# Patient Record
Sex: Female | Born: 1980 | Race: White | Hispanic: No | Marital: Married | Smoking: Never smoker
Health system: Southern US, Community
[De-identification: ages and names within clinical notes are randomized; demographics above are authoritative.]

## PROBLEM LIST (undated history)

## (undated) DIAGNOSIS — F32A Depression, unspecified: Secondary | ICD-10-CM

## (undated) DIAGNOSIS — J45909 Unspecified asthma, uncomplicated: Secondary | ICD-10-CM

## (undated) DIAGNOSIS — R519 Headache, unspecified: Secondary | ICD-10-CM

## (undated) DIAGNOSIS — R51 Headache: Secondary | ICD-10-CM

## (undated) DIAGNOSIS — E039 Hypothyroidism, unspecified: Secondary | ICD-10-CM

## (undated) DIAGNOSIS — F419 Anxiety disorder, unspecified: Secondary | ICD-10-CM

## (undated) DIAGNOSIS — I499 Cardiac arrhythmia, unspecified: Secondary | ICD-10-CM

## (undated) DIAGNOSIS — F329 Major depressive disorder, single episode, unspecified: Secondary | ICD-10-CM

## (undated) HISTORY — PX: DIAGNOSTIC LAPAROSCOPY: SUR761

## (undated) HISTORY — PX: TONSILLECTOMY: SUR1361

## (undated) HISTORY — PX: ABDOMINAL HYSTERECTOMY: SHX81

---
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
## 2000-11-23 ENCOUNTER — Other Ambulatory Visit: Admission: RE | Admit: 2000-11-23 | Discharge: 2000-11-23 | Payer: Self-pay | Admitting: Obstetrics and Gynecology

## 2001-01-26 ENCOUNTER — Encounter: Admission: RE | Admit: 2001-01-26 | Discharge: 2001-01-26 | Payer: Self-pay | Admitting: Obstetrics and Gynecology

## 2001-01-26 ENCOUNTER — Encounter: Payer: Self-pay | Admitting: Obstetrics and Gynecology

## 2001-05-19 ENCOUNTER — Encounter: Payer: Self-pay | Admitting: Obstetrics and Gynecology

## 2001-05-19 ENCOUNTER — Encounter: Admission: RE | Admit: 2001-05-19 | Discharge: 2001-05-19 | Payer: Self-pay | Admitting: Obstetrics and Gynecology

## 2001-09-08 ENCOUNTER — Other Ambulatory Visit: Admission: RE | Admit: 2001-09-08 | Discharge: 2001-09-08 | Payer: Self-pay

## 2001-11-22 ENCOUNTER — Ambulatory Visit (HOSPITAL_COMMUNITY): Admission: RE | Admit: 2001-11-22 | Discharge: 2001-11-22 | Payer: Self-pay | Admitting: Obstetrics and Gynecology

## 2003-12-17 ENCOUNTER — Other Ambulatory Visit: Admission: RE | Admit: 2003-12-17 | Discharge: 2003-12-17 | Payer: Self-pay | Admitting: Obstetrics and Gynecology

## 2003-12-31 ENCOUNTER — Ambulatory Visit (HOSPITAL_COMMUNITY): Admission: RE | Admit: 2003-12-31 | Discharge: 2003-12-31 | Payer: Self-pay | Admitting: Family Medicine

## 2005-03-09 ENCOUNTER — Other Ambulatory Visit: Admission: RE | Admit: 2005-03-09 | Discharge: 2005-03-09 | Payer: Self-pay | Admitting: Obstetrics and Gynecology

## 2006-02-05 ENCOUNTER — Emergency Department: Payer: Self-pay | Admitting: Emergency Medicine

## 2006-08-19 ENCOUNTER — Observation Stay: Payer: Self-pay

## 2006-08-25 ENCOUNTER — Inpatient Hospital Stay: Payer: Self-pay | Admitting: Obstetrics and Gynecology

## 2007-04-13 ENCOUNTER — Ambulatory Visit: Payer: Self-pay | Admitting: Internal Medicine

## 2007-12-03 ENCOUNTER — Ambulatory Visit: Payer: Self-pay | Admitting: Family Medicine

## 2008-10-12 ENCOUNTER — Ambulatory Visit: Payer: Self-pay | Admitting: Internal Medicine

## 2008-12-19 ENCOUNTER — Ambulatory Visit: Payer: Self-pay | Admitting: Obstetrics and Gynecology

## 2009-04-18 ENCOUNTER — Observation Stay: Payer: Self-pay

## 2009-05-07 ENCOUNTER — Observation Stay: Payer: Self-pay | Admitting: Obstetrics and Gynecology

## 2009-05-08 ENCOUNTER — Ambulatory Visit: Payer: Self-pay | Admitting: Obstetrics and Gynecology

## 2009-05-29 ENCOUNTER — Inpatient Hospital Stay: Payer: Self-pay | Admitting: Obstetrics and Gynecology

## 2009-12-15 ENCOUNTER — Ambulatory Visit: Payer: Self-pay | Admitting: Internal Medicine

## 2010-11-13 ENCOUNTER — Emergency Department: Payer: Self-pay | Admitting: Emergency Medicine

## 2013-10-21 ENCOUNTER — Encounter (HOSPITAL_COMMUNITY): Payer: Self-pay | Admitting: Emergency Medicine

## 2013-10-21 ENCOUNTER — Emergency Department (INDEPENDENT_AMBULATORY_CARE_PROVIDER_SITE_OTHER): Payer: BC Managed Care – PPO

## 2013-10-21 ENCOUNTER — Emergency Department (HOSPITAL_COMMUNITY)
Admission: EM | Admit: 2013-10-21 | Discharge: 2013-10-21 | Disposition: A | Discharge status: Home / Self Care | Payer: BC Managed Care – PPO | Source: Home / Self Care | Attending: Family Medicine | Admitting: Family Medicine

## 2013-10-21 DIAGNOSIS — J4521 Mild intermittent asthma with (acute) exacerbation: Secondary | ICD-10-CM

## 2013-10-21 DIAGNOSIS — R071 Chest pain on breathing: Secondary | ICD-10-CM

## 2013-10-21 DIAGNOSIS — J45901 Unspecified asthma with (acute) exacerbation: Secondary | ICD-10-CM

## 2013-10-21 DIAGNOSIS — R0789 Other chest pain: Secondary | ICD-10-CM

## 2013-10-21 HISTORY — DX: Unspecified asthma, uncomplicated: J45.909

## 2013-10-21 MED ORDER — METHYLPREDNISOLONE SODIUM SUCC 125 MG IJ SOLR
125.0000 mg | Freq: Once | INTRAMUSCULAR | Status: AC
  Administered 2013-10-21: 125 mg via INTRAMUSCULAR

## 2013-10-21 MED ORDER — IPRATROPIUM BROMIDE 0.02 % IN SOLN
0.5000 mg | Freq: Once | RESPIRATORY_TRACT | Status: AC
  Administered 2013-10-21: 0.5 mg via RESPIRATORY_TRACT

## 2013-10-21 MED ORDER — DEXTROMETHORPHAN POLISTIREX 30 MG/5ML PO LQCR: 60.0000 mg | Freq: Two times a day (BID) | ORAL | Status: DC

## 2013-10-21 MED ORDER — ALBUTEROL SULFATE (5 MG/ML) 0.5% IN NEBU
5.0000 mg | INHALATION_SOLUTION | Freq: Once | RESPIRATORY_TRACT | Status: AC
  Administered 2013-10-21: 5 mg via RESPIRATORY_TRACT

## 2013-10-21 MED ORDER — ALBUTEROL SULFATE (5 MG/ML) 0.5% IN NEBU
INHALATION_SOLUTION | RESPIRATORY_TRACT | Status: AC
  Filled 2013-10-21: qty 1

## 2013-10-21 MED ORDER — METHYLPREDNISOLONE SODIUM SUCC 125 MG IJ SOLR
INTRAMUSCULAR | Status: AC
  Filled 2013-10-21: qty 2

## 2013-10-21 MED ORDER — IPRATROPIUM BROMIDE 0.02 % IN SOLN
RESPIRATORY_TRACT | Status: AC
  Filled 2013-10-21: qty 2.5

## 2013-10-21 MED ORDER — AZITHROMYCIN 250 MG PO TABS: ORAL_TABLET | ORAL | Status: DC

## 2013-10-21 MED ORDER — SODIUM CHLORIDE 0.9 % IN NEBU
INHALATION_SOLUTION | RESPIRATORY_TRACT | Status: AC
  Filled 2013-10-21: qty 3

## 2013-10-21 NOTE — ED Notes (Signed)
Pt reports tightness in chest, cough, and difficulty breathing. Denies fever or chills.

## 2013-10-21 NOTE — ED Provider Notes (Signed)
CSN: [id number]     Arrival date & time 10/21/13  0941 History   First MD Initiated Contact with Patient 10/21/13 1003     Chief Complaint  Patient presents with  . Respiratory Distress  . Cough   (Consider location/radiation/quality/duration/timing/severity/associated sxs/prior Treatment) Patient is a 32 y.o. female presenting with cough. The history is provided by the patient.  Cough Cough characteristics:  Productive and harsh Sputum characteristics:  Yellow Severity:  Moderate Onset quality:  Gradual Duration:  1 week Progression:  Worsening Chronicity:  New Smoker: no   Context: upper respiratory infection   Ineffective treatments:  Beta-agonist inhaler Associated symptoms: chest pain, shortness of breath and wheezing   Associated symptoms: no rhinorrhea     Past Medical History  Diagnosis Date  . Asthma    History reviewed. No pertinent past surgical history. History reviewed. No pertinent family history. History  Substance Use Topics  . Smoking status: Never Smoker   . Smokeless tobacco: Not on file  . Alcohol Use: No   OB History   Grav Para Term Preterm Abortions TAB SAB Ect Mult Living                 Review of Systems  Constitutional: Negative.   HENT: Positive for congestion. Negative for rhinorrhea.   Respiratory: Positive for cough, shortness of breath and wheezing.   Cardiovascular: Positive for chest pain.  Gastrointestinal: Negative.     Allergies  Codeine  Home Medications   Current Outpatient Rx  Name  Route  Sig  Dispense  Refill  . escitalopram (LEXAPRO) 10 MG tablet   Oral   Take 10 mg by mouth daily.         Marland Kitchen azithromycin (ZITHROMAX Z-PAK) 250 MG tablet      Take as directed on pack   6 each   0   . dextromethorphan (DELSYM) 30 MG/5ML liquid   Oral   Take 10 mLs (60 mg total) by mouth 2 (two) times daily. Prn cough   89 mL   1    BP 124/85  Pulse 96  Temp(Src) 97.8 F (36.6 C) (Oral)  SpO2 95% Physical Exam   Nursing note and vitals reviewed. Constitutional: She is oriented to person, place, and time. She appears well-developed and well-nourished.  HENT:  Head: Normocephalic.  Right Ear: External ear normal.  Left Ear: External ear normal.  Mouth/Throat: Oropharynx is clear and moist.  Eyes: Conjunctivae are normal. Pupils are equal, round, and reactive to light.  Neck: Normal range of motion. Neck supple.  Pulmonary/Chest: Effort normal and breath sounds normal. No respiratory distress. She has no wheezes.  Lymphadenopathy:    She has no cervical adenopathy.  Neurological: She is alert and oriented to person, place, and time.  Skin: Skin is warm and dry.    ED Course  Procedures (including critical care time) Labs Review Labs Reviewed - No data to display Imaging Review Dg Chest 2 View  10/21/2013   CLINICAL DATA:  Cough, shortness of Breath  EXAM: CHEST  2 VIEW  COMPARISON:  None.  FINDINGS: Cardiomediastinal silhouette is unremarkable. No acute infiltrate or pleural effusion. No pulmonary edema. Bony thorax is unremarkable. Mild perihilar increased bronchial markings.  IMPRESSION: No acute infiltrate or pulmonary edema. Mild perihilar increased bronchial markings without focal consolidation.   Electronically Signed   By: Natasha Mead M.D.   On: 10/21/2013 10:18    EKG Interpretation    Date/Time:    Ventricular Rate:  PR Interval:    QRS Duration:   QT Interval:    QTC Calculation:   R Axis:     Text Interpretation:              MDM      Linna Hoff, MD 10/21/13 1052

## 2015-02-12 ENCOUNTER — Ambulatory Visit
Admit: 2015-02-12 | Disposition: A | Discharge status: Home / Self Care | Payer: Self-pay | Attending: Obstetrics and Gynecology | Admitting: Obstetrics and Gynecology

## 2016-05-12 ENCOUNTER — Other Ambulatory Visit: Payer: Self-pay

## 2016-05-17 ENCOUNTER — Other Ambulatory Visit: Payer: Self-pay | Admitting: Internal Medicine

## 2016-05-17 DIAGNOSIS — R609 Edema, unspecified: Secondary | ICD-10-CM

## 2016-05-19 ENCOUNTER — Ambulatory Visit (INDEPENDENT_AMBULATORY_CARE_PROVIDER_SITE_OTHER): Payer: BC Managed Care – PPO

## 2016-05-19 ENCOUNTER — Other Ambulatory Visit: Payer: Self-pay

## 2016-05-19 DIAGNOSIS — R609 Edema, unspecified: Secondary | ICD-10-CM

## 2016-05-19 LAB — ECHOCARDIOGRAM COMPLETE
CHL CUP STROKE VOLUME: 41 mL
E decel time: 366 msec
EERAT: 4.16
FS: 37 % (ref 28–44)
IV/PV OW: 0.97
LA ID, A-P, ES: 38 mm
LA diam end sys: 38 mm
LA vol A4C: 44.2 ml
LA vol: 54.4 mL
LV E/e' medial: 4.16
LV dias vol: 69 mL (ref 46–106)
LV e' LATERAL: 14.7 cm/s
LVEEAVG: 4.16
LVOT SV: 64 mL
LVOT VTI: 18.6 cm
LVOT area: 3.46 cm2
LVOT peak vel: 76.9 cm/s
LVOTD: 21 mm
LVSYSVOL: 28 mL (ref 14–42)
MV Dec: 366
MV pk A vel: 35.4 m/s
MV pk E vel: 61.1 m/s
PW: 7.02 mm — AB (ref 0.6–1.1)
RV TAPSE: 16.5 mm
Simpson's disk: 59
TDI e' lateral: 14.7
TDI e' medial: 9.9

## 2016-07-26 ENCOUNTER — Encounter: Payer: Self-pay | Admitting: Pediatrics

## 2016-11-29 ENCOUNTER — Other Ambulatory Visit: Payer: Self-pay | Admitting: Obstetrics and Gynecology

## 2016-11-29 DIAGNOSIS — Z1231 Encounter for screening mammogram for malignant neoplasm of breast: Secondary | ICD-10-CM

## 2016-12-27 ENCOUNTER — Ambulatory Visit
Admission: RE | Admit: 2016-12-27 | Discharge: 2016-12-27 | Disposition: A | Discharge status: Home / Self Care | Payer: BC Managed Care – PPO | Source: Ambulatory Visit | Attending: Obstetrics and Gynecology | Admitting: Obstetrics and Gynecology

## 2016-12-27 DIAGNOSIS — Z1231 Encounter for screening mammogram for malignant neoplasm of breast: Secondary | ICD-10-CM | POA: Insufficient documentation

## 2016-12-31 ENCOUNTER — Other Ambulatory Visit: Payer: Self-pay | Admitting: Obstetrics and Gynecology

## 2016-12-31 DIAGNOSIS — R928 Other abnormal and inconclusive findings on diagnostic imaging of breast: Secondary | ICD-10-CM

## 2016-12-31 DIAGNOSIS — N6489 Other specified disorders of breast: Secondary | ICD-10-CM

## 2017-01-06 ENCOUNTER — Ambulatory Visit
Admission: RE | Admit: 2017-01-06 | Discharge: 2017-01-06 | Disposition: A | Discharge status: Home / Self Care | Payer: BC Managed Care – PPO | Source: Ambulatory Visit | Attending: Obstetrics and Gynecology | Admitting: Obstetrics and Gynecology

## 2017-01-06 DIAGNOSIS — R928 Other abnormal and inconclusive findings on diagnostic imaging of breast: Secondary | ICD-10-CM | POA: Diagnosis present

## 2017-01-06 DIAGNOSIS — N6489 Other specified disorders of breast: Secondary | ICD-10-CM

## 2017-01-07 ENCOUNTER — Emergency Department: Payer: BC Managed Care – PPO

## 2017-01-07 ENCOUNTER — Emergency Department
Admission: EM | Admit: 2017-01-07 | Discharge: 2017-01-07 | Disposition: A | Discharge status: Home / Self Care | Payer: BC Managed Care – PPO | Attending: Emergency Medicine | Admitting: Emergency Medicine

## 2017-01-07 DIAGNOSIS — Z79899 Other long term (current) drug therapy: Secondary | ICD-10-CM | POA: Insufficient documentation

## 2017-01-07 DIAGNOSIS — R509 Fever, unspecified: Secondary | ICD-10-CM | POA: Insufficient documentation

## 2017-01-07 DIAGNOSIS — R110 Nausea: Secondary | ICD-10-CM | POA: Insufficient documentation

## 2017-01-07 DIAGNOSIS — R1011 Right upper quadrant pain: Secondary | ICD-10-CM | POA: Insufficient documentation

## 2017-01-07 DIAGNOSIS — J45909 Unspecified asthma, uncomplicated: Secondary | ICD-10-CM | POA: Insufficient documentation

## 2017-01-07 LAB — COMPREHENSIVE METABOLIC PANEL
ALT: 16 U/L (ref 14–54)
AST: 31 U/L (ref 15–41)
Albumin: 4.4 g/dL (ref 3.5–5.0)
Alkaline Phosphatase: 61 U/L (ref 38–126)
Anion gap: 9 (ref 5–15)
BILIRUBIN TOTAL: 1.3 mg/dL — AB (ref 0.3–1.2)
BUN: 14 mg/dL (ref 6–20)
CHLORIDE: 104 mmol/L (ref 101–111)
CO2: 25 mmol/L (ref 22–32)
Calcium: 9.5 mg/dL (ref 8.9–10.3)
Creatinine, Ser: 0.71 mg/dL (ref 0.44–1.00)
GFR calc Af Amer: >60 mL/min (ref >60)
GFR calc non Af Amer: >60 mL/min (ref >60)
GLUCOSE: 97 mg/dL (ref 65–99)
Potassium: 3.6 mmol/L (ref 3.5–5.1)
Sodium: 138 mmol/L (ref 135–145)
Total Protein: 7.4 g/dL (ref 6.5–8.1)

## 2017-01-07 LAB — URINALYSIS, COMPLETE (UACMP) WITH MICROSCOPIC
Bilirubin Urine: NEGATIVE
Glucose, UA: NEGATIVE mg/dL
Hgb urine dipstick: NEGATIVE
Ketones, ur: NEGATIVE mg/dL
Nitrite: NEGATIVE
PROTEIN: NEGATIVE mg/dL
Specific Gravity, Urine: 1.017 (ref 1.005–1.030)
pH: 6 (ref 5.0–8.0)

## 2017-01-07 LAB — CBC WITH DIFFERENTIAL/PLATELET
BASOS ABS: 0 10*3/uL (ref 0–0.1)
Basophils Relative: 0 %
Eosinophils Absolute: 0.1 10*3/uL (ref 0–0.7)
Eosinophils Relative: 1 %
HEMATOCRIT: 41.2 % (ref 35.0–47.0)
HEMOGLOBIN: 14.5 g/dL (ref 12.0–16.0)
LYMPHS PCT: 10 %
Lymphs Abs: 1.1 10*3/uL (ref 1.0–3.6)
MCH: 29.7 pg (ref 26.0–34.0)
MCHC: 35.2 g/dL (ref 32.0–36.0)
MCV: 84.1 fL (ref 80.0–100.0)
MONO ABS: 0.6 10*3/uL (ref 0.2–0.9)
Monocytes Relative: 6 %
NEUTROS PCT: 83 %
Neutro Abs: 8.8 10*3/uL — ABNORMAL HIGH (ref 1.4–6.5)
Platelets: 308 10*3/uL (ref 150–440)
RBC: 4.9 MIL/uL (ref 3.80–5.20)
RDW: 13.2 % (ref 11.5–14.5)
WBC: 10.6 10*3/uL (ref 3.6–11.0)

## 2017-01-07 LAB — LIPASE, BLOOD: Lipase: 27 U/L (ref 11–51)

## 2017-01-07 LAB — PREGNANCY, URINE: Preg Test, Ur: NEGATIVE

## 2017-01-07 MED ORDER — KETOROLAC TROMETHAMINE 30 MG/ML IJ SOLN
15.0000 mg | Freq: Once | INTRAMUSCULAR | Status: AC
  Administered 2017-01-07: 15 mg via INTRAVENOUS
  Filled 2017-01-07: qty 1

## 2017-01-07 MED ORDER — POLYETHYLENE GLYCOL 3350 17 G PO PACK: 17.0000 g | PACK | Freq: Every day | ORAL | 0 refills | Status: DC

## 2017-01-07 MED ORDER — MORPHINE SULFATE (PF) 2 MG/ML IV SOLN
2.0000 mg | Freq: Once | INTRAVENOUS | Status: AC
  Administered 2017-01-07: 2 mg via INTRAVENOUS

## 2017-01-07 MED ORDER — DICYCLOMINE HCL 10 MG PO CAPS
10.0000 mg | ORAL_CAPSULE | Freq: Once | ORAL | Status: AC
  Administered 2017-01-07: 10 mg via ORAL
  Filled 2017-01-07: qty 1

## 2017-01-07 MED ORDER — SODIUM CHLORIDE 0.9 % IV BOLUS (SEPSIS): 1000.0000 mL | Freq: Once | INTRAVENOUS | Status: DC

## 2017-01-07 MED ORDER — IOPAMIDOL (ISOVUE-300) INJECTION 61%
30.0000 mL | Freq: Once | INTRAVENOUS | Status: AC | PRN
  Administered 2017-01-07: 30 mL via ORAL

## 2017-01-07 MED ORDER — MORPHINE SULFATE (PF) 2 MG/ML IV SOLN
INTRAVENOUS | Status: AC
  Administered 2017-01-07: 2 mg via INTRAVENOUS
  Filled 2017-01-07: qty 1

## 2017-01-07 MED ORDER — ONDANSETRON HCL 4 MG/2ML IJ SOLN
4.0000 mg | Freq: Once | INTRAMUSCULAR | Status: AC
  Administered 2017-01-07: 4 mg via INTRAVENOUS
  Filled 2017-01-07: qty 2

## 2017-01-07 MED ORDER — HYDROCODONE-ACETAMINOPHEN 5-325 MG PO TABS: 1.0000 | ORAL_TABLET | ORAL | 0 refills | Status: DC | PRN

## 2017-01-07 MED ORDER — IOPAMIDOL (ISOVUE-300) INJECTION 61%
100.0000 mL | Freq: Once | INTRAVENOUS | Status: AC | PRN
Start: 2017-01-07 — End: 2017-01-07
  Administered 2017-01-07: 100 mL via INTRAVENOUS

## 2017-01-07 MED ORDER — HYDROMORPHONE HCL 1 MG/ML IJ SOLN
1.0000 mg | Freq: Once | INTRAMUSCULAR | Status: AC
  Administered 2017-01-07: 0.2 mg via INTRAVENOUS
  Filled 2017-01-07: qty 1

## 2017-01-07 MED ORDER — SODIUM CHLORIDE 0.9 % IV BOLUS (SEPSIS)
1000.0000 mL | Freq: Once | INTRAVENOUS | Status: AC
  Administered 2017-01-07: 1000 mL via INTRAVENOUS

## 2017-01-07 MED ORDER — MORPHINE SULFATE (PF) 2 MG/ML IV SOLN
INTRAVENOUS | Status: AC
  Filled 2017-01-07: qty 1

## 2017-01-07 MED ORDER — DICYCLOMINE HCL 10 MG PO CAPS: 10.0000 mg | ORAL_CAPSULE | Freq: Three times a day (TID) | ORAL | 0 refills | Status: DC | PRN

## 2017-01-07 MED ORDER — PROMETHAZINE HCL 12.5 MG PO TABS: 12.5000 mg | ORAL_TABLET | Freq: Four times a day (QID) | ORAL | 0 refills | Status: DC | PRN

## 2017-01-07 MED ORDER — HYDROCODONE-ACETAMINOPHEN 5-325 MG PO TABS
1.0000 | ORAL_TABLET | Freq: Once | ORAL | Status: AC
  Administered 2017-01-07: 1 via ORAL
  Filled 2017-01-07: qty 1

## 2017-01-07 NOTE — ED Provider Notes (Signed)
Patient received in sign-out from Dr. Joni Fears.  Workup and evaluation pending CT imaging and pain control. CT imaging shows no evidence of acute pathology that would likely explain the patient's symptoms. There is some diffuse adenitis given her low-grade fever may have a component of viral process causing some of her discomfort. Repeat abdominal exam does not show any peritonitis. She's been able to tolerate food and oral hydration. Pain is improved.  Patient was able to tolerate PO and was able to ambulate with a steady gait.  Patient will be provided referral for outpatient follow up with surgery. Discussed signs and symptoms for which the patient should return to the Er.  Have discussed with the patient and available family all diagnostics and treatments performed thus far and all questions were answered to the best of my ability. The patient demonstrates understanding and agreement with plan.       Merlyn Lot, MD 01/07/17 [phone]

## 2017-01-07 NOTE — ED Provider Notes (Signed)
[facility] Emergency Department Provider Note  ____________________________________________  Time seen: Approximately 1:41 PM  I have reviewed the triage vital signs and the nursing notes.   HISTORY  Chief Complaint Abdominal Pain    HPI Kimberly Mcbride is a 36 y.o. female who complains of right upper quadrant abdominal pain radiating to the back since 5 AM today. Never had anything like this before. Constant, waxing and waning, severe. Associated with nausea no vomiting. Not aggravated by eating. Has a strong family history of biliary issues. Subjective fevers and chills at home. Worse with being upright and walking.     Past Medical History:  Diagnosis Date  . Asthma      There are no active problems to display for this patient.    No past surgical history on file. Diagnostic laparoscopy LEEP procedure and tonsillectomy  Prior to Admission medications   Medication Sig Start Date End Date Taking? Authorizing Provider  azithromycin (ZITHROMAX Z-PAK) 250 MG tablet Take as directed on pack 10/21/13   Billy Fischer, MD  dextromethorphan (DELSYM) 30 MG/5ML liquid Take 10 mLs (60 mg total) by mouth 2 (two) times daily. Prn cough 10/21/13   Billy Fischer, MD  escitalopram (LEXAPRO) 10 MG tablet Take 10 mg by mouth daily.    Historical Provider, MD     Allergies Codeine   No family history on file.  Social History Social History  Substance Use Topics  . Smoking status: Never Smoker  . Smokeless tobacco: Not on file  . Alcohol use No    Review of Systems  Constitutional:   Positive subjective fever with chills.  ENT:   No sore throat. No rhinorrhea. Cardiovascular:   No chest pain. Respiratory:   No dyspnea or cough. Gastrointestinal:   Positive abdominal pain as above without vomiting and diarrhea.  Genitourinary:   Negative for dysuria or difficulty urinating. Musculoskeletal:   Negative for focal pain or swelling Neurological:    Negative for headaches 10-point ROS otherwise negative.  ____________________________________________   PHYSICAL EXAM:  VITAL SIGNS: ED Triage Vitals [01/07/17 0917]  Enc Vitals Group     BP 110/71     Pulse Rate (!) 109     Resp 20     Temp 99.5 F (37.5 C)     Temp Source Oral     SpO2 99 %     Weight 157 lb (71.2 kg)     Height 5\' 3"  (1.6 m)     Head Circumference      Peak Flow      Pain Score 8     Pain Loc      Pain Edu?      Excl. in [place]?     Vital signs reviewed, nursing assessments reviewed.   Constitutional:   Alert and oriented. Uncomfortable but not in distress. Eyes:   No scleral icterus. No conjunctival pallor. PERRL. EOMI.  No nystagmus. ENT   Head:   Normocephalic and atraumatic.   Nose:   No congestion/rhinnorhea. No septal hematoma   Mouth/Throat:   MMM, no pharyngeal erythema. No peritonsillar mass.    Neck:   No stridor. No SubQ emphysema. No meningismus. Hematological/Lymphatic/Immunilogical:   No cervical lymphadenopathy. Cardiovascular:   RRR. Symmetric bilateral radial and DP pulses.  No murmurs.  Respiratory:   Normal respiratory effort without tachypnea nor retractions. Breath sounds are clear and equal bilaterally. No wheezes/rales/rhonchi. Gastrointestinal:   Soft with right upper quadrant tenderness.. Non distended. There is no  CVA tenderness.  No rebound, rigidity, or guarding. Genitourinary:   deferred Musculoskeletal:   Normal range of motion in all extremities. No joint effusions.  No lower extremity tenderness.  No edema. Neurologic:   Normal speech and language.  CN 2-10 normal. Motor grossly intact. No gross focal neurologic deficits are appreciated.  Skin:    Skin is warm, dry and intact. No rash noted.  No petechiae, purpura, or bullae.  ____________________________________________    LABS (pertinent positives/negatives) (all labs ordered are listed, but only abnormal results are displayed) Labs Reviewed   COMPREHENSIVE METABOLIC PANEL - Abnormal; Notable for the following:       Result Value   Total Bilirubin 1.3 (*)    All other components within normal limits  URINALYSIS, COMPLETE (UACMP) WITH MICROSCOPIC - Abnormal; Notable for the following:    Color, Urine YELLOW (*)    APPearance HAZY (*)    Leukocytes, UA TRACE (*)    Bacteria, UA RARE (*)    Squamous Epithelial / LPF 6-30 (*)    All other components within normal limits  CBC WITH DIFFERENTIAL/PLATELET - Abnormal; Notable for the following:    Neutro Abs 8.8 (*)    All other components within normal limits  LIPASE, BLOOD  PREGNANCY, URINE   ____________________________________________   EKG    ____________________________________________    RADIOLOGY  US Abdomen Limited Ruq  Result Date: 01/07/2017 CLINICAL DATA:  Right upper quadrant ultrasound extending to the back. EXAM: US ABDOMEN LIMITED - RIGHT UPPER QUADRANT COMPARISON:  None available. FINDINGS: Gallbladder: No gallstones or wall thickening visualized. No sonographic Murphy sign noted by sonographer. Maximal wall thickness is 2.1 mm. Common bile duct: Diameter: 2.8 mm, within normal limits Liver: No focal lesion identified. Within normal limits in parenchymal echogenicity. IMPRESSION: Negative right upper quadrant ultrasound. Electronically Signed   By: San Morelle M.D.   On: 01/07/2017 11:54    ____________________________________________   PROCEDURES Procedures  ____________________________________________   INITIAL IMPRESSION / ASSESSMENT AND PLAN / ED COURSE  Pertinent labs & imaging results that were available during my care of the patient were reviewed by me and considered in my medical decision making (see chart for details).  Patient presents with right upper quadrant pain concerning for biliary pathology. Exam is compatible with this presumptive diagnosis as well. Patient worked up with labs and ultrasound right upper quadrant. IV  opioids for pain control, Zofran, IV fluids.     Clinical Course as of Jan 08 1340  Fri Jan 07, 2017  1328 Labs and ultrasound unremarkable. Patient has persistent pain and abdominal tenderness. With her initial elevated temperature and tachycardia, will continue to pursue workup with CT abdomen pelvis.  [PS]    Clinical Course User Index [PS] Carrie Mew, MD   Case signed out to Dr. Quentin Cornwall at 3:00 PM to follow up on CT scan for disposition  ____________________________________________   FINAL CLINICAL IMPRESSION(S) / ED DIAGNOSES  Final diagnoses:  None  Upper abdominal pain.    New Prescriptions   No medications on file     Portions of this note were generated with dragon dictation software. Dictation errors may occur despite best attempts at proofreading.    Carrie Mew, MD 01/07/17 1515

## 2017-01-07 NOTE — ED Triage Notes (Signed)
Pt states that she was awakened at 0500 this am with ruq abd pain radiating into her back and across to her mid abd. Pt reports nausea, states that she recently traveled out of the country to [place] returning feb 8th

## 2017-01-07 NOTE — Discharge Instructions (Signed)

## 2017-01-10 ENCOUNTER — Encounter: Payer: Self-pay | Admitting: Physician Assistant

## 2017-01-11 ENCOUNTER — Other Ambulatory Visit: Payer: Self-pay | Admitting: Internal Medicine

## 2017-01-11 DIAGNOSIS — R1011 Right upper quadrant pain: Secondary | ICD-10-CM

## 2017-01-11 DIAGNOSIS — R1013 Epigastric pain: Secondary | ICD-10-CM

## 2017-01-12 ENCOUNTER — Ambulatory Visit: Payer: BC Managed Care – PPO

## 2017-01-12 ENCOUNTER — Other Ambulatory Visit: Payer: BC Managed Care – PPO

## 2017-01-14 ENCOUNTER — Ambulatory Visit
Admission: RE | Admit: 2017-01-14 | Discharge: 2017-01-14 | Disposition: A | Discharge status: Home / Self Care | Payer: BC Managed Care – PPO | Source: Ambulatory Visit | Attending: Internal Medicine | Admitting: Internal Medicine

## 2017-01-14 DIAGNOSIS — R1011 Right upper quadrant pain: Secondary | ICD-10-CM | POA: Diagnosis present

## 2017-01-14 DIAGNOSIS — R1013 Epigastric pain: Secondary | ICD-10-CM | POA: Diagnosis not present

## 2017-01-14 MED ORDER — TECHNETIUM TC 99M MEBROFENIN IV KIT
5.0000 | PACK | Freq: Once | INTRAVENOUS | Status: AC | PRN
  Administered 2017-01-14: 5.25 via INTRAVENOUS

## 2017-01-18 ENCOUNTER — Telehealth: Payer: Self-pay | Admitting: *Deleted

## 2017-01-18 ENCOUNTER — Ambulatory Visit (INDEPENDENT_AMBULATORY_CARE_PROVIDER_SITE_OTHER): Payer: BC Managed Care – PPO | Admitting: Gastroenterology

## 2017-01-18 ENCOUNTER — Ambulatory Visit: Payer: BC Managed Care – PPO | Admitting: Physician Assistant

## 2017-01-18 ENCOUNTER — Encounter: Payer: Self-pay | Admitting: Gastroenterology

## 2017-01-18 VITALS — BP 123/75 | HR 82 | Temp 98.9°F

## 2017-01-18 DIAGNOSIS — R109 Unspecified abdominal pain: Secondary | ICD-10-CM | POA: Diagnosis not present

## 2017-01-18 MED ORDER — CYCLOBENZAPRINE HCL 5 MG PO TABS: 5.0000 mg | ORAL_TABLET | Freq: Three times a day (TID) | ORAL | 0 refills | Status: AC

## 2017-01-18 NOTE — Telephone Encounter (Signed)
Prescription for flexeril sent to patient's pharmacy on record.

## 2017-01-18 NOTE — Progress Notes (Signed)
Gastroenterology Consultation  Referring Provider:     Casilda Carls Primary Care Physician:  Casilda Carls Primary Gastroenterologist:  Dr. Allen Norris     Reason for Consultation:     Right-sided abdominal pain        HPI:   Kimberly Mcbride is a 36 y.o. y/o female referred for consultation & management of Right-sided abdominal pain by Dr. Casilda Carls.  This patient comes in today after being seen in the emergency room for right upper quadrant pain. The patient had a right upper quadrant ultrasound with a HIDA scan that showed normal gallbladder ejection fraction but the patient did report some pain when she took the ensure. The pain had started the day of the emergency room visit in the early mornings. It was reported that it was not associated with any vomiting but she did have nausea. The patient does report a family history of gallbladder issues. The patient also CT scan that did not show any cause for her symptoms. The patient's blood work also did not show any abnormalities and her lipase was normal. The patient has a history of back pain and she reports that her right sided abdominal pain is in the upper and lower part but mostly at the ribs as [name] the right.  Past Medical History:  Diagnosis Date  . Asthma     No past surgical history on file.  Prior to Admission medications   Medication Sig Start Date End Date Taking? Authorizing Provider  hydrochlorothiazide (HYDRODIURIL) 25 MG tablet  12/27/16  Yes Historical Provider, MD  SYNTHROID 112 MCG tablet  12/26/16  Yes Historical Provider, MD    No family history on file.   Social History  Substance Use Topics  . Smoking status: Never Smoker  . Smokeless tobacco: Not on file  . Alcohol use No    Allergies as of 01/18/2017 - Review Complete 01/18/2017  Allergen Reaction Noted  . Codeine  10/21/2013    Review of Systems:    All systems reviewed and negative except where noted in HPI.   Physical Exam:  BP 123/75 (BP Location:  Right Arm, Patient Position: Sitting, Cuff Size: Normal)   Pulse 82   Temp 98.9 F (37.2 C) (Oral)   LMP 01/14/2017 Comment: neg preg test 01/07/17 Patient's last menstrual period was 01/14/2017. Psych:  Alert and cooperative. Normal mood and affect. General:   Alert,  Well-developed, well-nourished, pleasant and cooperative in NAD Head:  Normocephalic and atraumatic. Eyes:  Sclera clear, no icterus.   Conjunctiva pink. Ears:  Normal auditory acuity. Nose:  No deformity, discharge, or lesions. Mouth:  No deformity or lesions,oropharynx pink & moist. Neck:  Supple; no masses or thyromegaly. Lungs:  Respirations even and unlabored.  Clear throughout to auscultation.   No wheezes, crackles, or rhonchi. No acute distress. Heart:  Regular rate and rhythm; no murmurs, clicks, rubs, or gallops. Abdomen:  Normal bowel sounds.  No bruits.  Soft, tender to 1 finger palpation while lifting the patient's legs 6 inches above the exam table and non-distended without masses, hepatosplenomegaly or hernias noted.  No guarding or rebound tenderness.  Positive Carnett sign.   Rectal:  Deferred.  Msk:  Symmetrical without gross deformities.  Good, equal movement & strength bilaterally. Pulses:  Normal pulses noted. Extremities:  No clubbing or edema.  No cyanosis. Neurologic:  Alert and oriented x3;  grossly normal neurologically. Skin:  Intact without significant lesions or rashes.  No jaundice. Lymph Nodes:  No significant cervical  adenopathy. Psych:  Alert and cooperative. Normal mood and affect.  Imaging Studies: Ct Abdomen Pelvis W Contrast  Result Date: 01/07/2017 CLINICAL DATA:  Right upper quadrant abdominal pain since this morning. Nausea. EXAM: CT ABDOMEN AND PELVIS WITH CONTRAST TECHNIQUE: Multidetector CT imaging of the abdomen and pelvis was performed using the standard protocol following bolus administration of intravenous contrast. CONTRAST:  187mL ISOVUE-300 IOPAMIDOL (ISOVUE-300) INJECTION  61% COMPARISON:  Ultrasound 01/07/2017 FINDINGS: Lower chest: The lung bases are clear of acute process. Two small pulmonary nodules are noted in the right lower lobe on image number 2 and 3. No pleural effusion. The heart is normal in size. No pericardial effusion. The distal esophagus is grossly normal. Hepatobiliary: No focal hepatic lesions or intrahepatic biliary dilatation. The gallbladder is normal. No common bile duct dilatation. Pancreas: No mass, inflammation or ductal dilatation. Spleen: Normal size.  No focal lesions. Adrenals/Urinary Tract: The adrenal glands and kidneys are normal. No ureteral or bladder calculi. Stomach/Bowel: The stomach, duodenum, small bowel and colon are grossly normal without oral contrast. No inflammatory changes, mass lesions or obstructive findings. The terminal ileum and appendix are normal. Vascular/Lymphatic: The aorta and branch vessels are normal. The major venous structures are patent. Small scattered mesenteric and retroperitoneal lymph nodes. Could not exclude mesenteric adenitis. Reproductive: The uterus and ovaries are unremarkable. There is a rim enhancing collapsing cyst on the right with a small amount of free pelvic fluid. This could be a leaking cyst. Other: No pelvic mass or adenopathy. No free pelvic fluid collections. No inguinal mass or adenopathy. No abdominal wall hernia or subcutaneous lesions. Musculoskeletal: No significant bony findings. IMPRESSION: 1. No CT findings for acute appendicitis or small bowel obstruction. 2. Numerous scattered mesenteric lymph nodes could suggest mesenteric adenitis. 3. Collapsed appearing rim enhancing right ovarian cyst with periadnexal and free pelvic fluid which could suggest a leaking cyst. 4. Two small pulmonary nodule at the right lung base. No follow-up needed if patient is low-risk (and has no known or suspected primary neoplasm). Non-contrast chest CT can be considered in 12 months if patient is high-risk. This  recommendation follows the consensus statement: Guidelines for Management of Incidental Pulmonary Nodules Detected on CT Images: From the Fleischner Society 2017; Radiology 2017; 284:228-243. Electronically Signed   By: Marijo Sanes M.D.   On: 01/07/2017 15:11   Nm Hepato W/eject Fract  Result Date: 01/14/2017 CLINICAL DATA:  Right upper quadrant and epigastric pain with nausea. EXAM: NUCLEAR MEDICINE HEPATOBILIARY IMAGING WITH GALLBLADDER EF TECHNIQUE: Sequential images of the abdomen were obtained out to 60 minutes following intravenous administration of radiopharmaceutical. After oral ingestion of Ensure, gallbladder ejection fraction was determined. At 60 min, normal ejection fraction is greater than 33%. RADIOPHARMACEUTICALS:  5.25 mCi Tc-76m  Choletec IV COMPARISON:  CT abdomen from 01/07/2017 FINDINGS: Satisfactory uptake of radiopharmaceutical from the blood pool. Biliary and bowel activity visible at 10 minutes. Gallbladder activity visible at 31 minutes. With 8 ounces of oral Ensure, the patient experienced right upper quadrant abdominal pain extending into the upper back, 5/10 severity. Calculated gallbladder ejection fraction is 60%. (Normal gallbladder ejection fraction with Ensure is greater than 33%.) IMPRESSION: 1. The patient did experience right upper quadrant abdominal pain upon drinking oral Ensure. However, the exam is otherwise normal. Gallbladder ejection fraction 60%. Electronically Signed   By: Van Clines M.D.   On: 01/14/2017 12:01   Mm Diag Breast Tomo Uni Left  Result Date: 01/06/2017 CLINICAL DATA:  Patient returns today to evaluate  possible left breast asymmetries identified on a recent baseline screening mammogram. EXAM: 2D DIGITAL DIAGNOSTIC UNILATERAL LEFT MAMMOGRAM WITH CAD AND ADJUNCT TOMO COMPARISON:  Baseline screening mammogram dated 12/27/2016. ACR Breast Density Category c: The breast tissue is heterogeneously dense, which may obscure small masses. FINDINGS: On  today's additional views with spot compression and 3D tomosynthesis, there is no persistent asymmetry within the left breast indicating superimposition of normal dense fibroglandular tissues. There are no dominant masses, suspicious calcifications or secondary signs of malignancy identified within the left breast on today's exam. Mammographic images were processed with CAD. IMPRESSION: No evidence of malignancy. Patient may return to routine annual bilateral screening mammogram schedule. RECOMMENDATION: Screening mammogram at age 49 unless there are persistent or intervening clinical concerns. (Code:SM-B-40A) I have discussed the findings and recommendations with the patient. Results were also provided in writing at the conclusion of the visit. If applicable, a reminder letter will be sent to the patient regarding the next appointment. BI-RADS CATEGORY  1: Negative. Electronically Signed   By: Franki Cabot M.D.   On: 01/06/2017 10:59   Mm Screening Breast Tomo Bilateral  Result Date: 12/28/2016 CLINICAL DATA:  Screening. Baseline examination. EXAM: 2D DIGITAL SCREENING BILATERAL MAMMOGRAM WITH CAD AND ADJUNCT TOMO COMPARISON:  None ACR Breast Density Category b: There are scattered areas of fibroglandular density. FINDINGS: In the left breast, a possible asymmetry warrants further evaluation. In the right breast, no findings suspicious for malignancy. Images were processed with CAD. IMPRESSION: Further evaluation is suggested for possible asymmetry in the left breast. RECOMMENDATION: Diagnostic mammogram and possibly ultrasound of the left breast. (Code:FI-L-70M) The patient will be contacted regarding the findings, and additional imaging will be scheduled. BI-RADS CATEGORY  0: Incomplete. Need additional imaging evaluation and/or prior mammograms for comparison. Electronically Signed   By: Margarette Canada M.D.   On: 12/28/2016 09:04   US Abdomen Limited Ruq  Result Date: 01/07/2017 CLINICAL DATA:  Right upper  quadrant ultrasound extending to the back. EXAM: US ABDOMEN LIMITED - RIGHT UPPER QUADRANT COMPARISON:  None available. FINDINGS: Gallbladder: No gallstones or wall thickening visualized. No sonographic Murphy sign noted by sonographer. Maximal wall thickness is 2.1 mm. Common bile duct: Diameter: 2.8 mm, within normal limits Liver: No focal lesion identified. Within normal limits in parenchymal echogenicity. IMPRESSION: Negative right upper quadrant ultrasound. Electronically Signed   By: San Morelle M.D.   On: 01/07/2017 11:54    Assessment and Plan:   Kimberly Mcbride is a 36 y.o. y/o female who comes in today with abdominal pain in the right side of her abdomen in the upper and lower part. The patient's main pain is right of the ribs at the insertion of the rectus abdominis muscles and the tendon. The patient has a pain reproducible by a straight leg lift without palpation and exacerbated with palpation. The patient also has chronic back pain is commonly seen with abdominal wall pain. The patient suprapubic pain on the right side is also consistent with musculoskeletal pain. The patient has been told to take Advil 2 tablets 3 times a day with food and to use a heating pad to the area. The patient will also be given a prescription for Flexeril to help with the muscle pain. None of the patient's symptoms are associated with any GI function such as diarrhea or constipation. The patient has been explained the plan and agrees with it.    Lucilla Lame, MD. Marval Regal   Note: This dictation was prepared with Dragon dictation  along with smaller phrase technology. Any transcriptional errors that result from this process are unintentional.

## 2017-01-31 ENCOUNTER — Ambulatory Visit: Payer: Self-pay | Admitting: Gastroenterology

## 2017-04-25 NOTE — H&P (Addendum)
Kimberly Mcbride is a 36 y.o. female here for LAVH and bilateral salpingectomy , possible right oophorectomy if significant pathology seen on right ovary  pt seen several times for pelvic pain , LBP And  was under the care of a chiropractor withinitial  decrease in pain , not helpful now  . Her bleeding has been irregular for some time .Bleeding currently off OCP has been 7-9 day q 21 days . ++ dysmenorrhea . Some " discomfort"  With intercourse , no real dyspareunia . Pelvic pain with hip pain . Pain may start before cycle or after . Pain is worse on right side of pelvis  No bladder issues. She was recently seen in [facility] for RUQ pain . CTscan and pelvic u/s failed to show and abnormalities except a small amt of free fluid in pelvis .  . . Pt has been tried on many different formulations of contraception + Mirena .Pt is currently Euthyroid being seen by Dr Rosario Jacks.  LAst pap smear 12/2016 ASCUS and neg HR HPV  Prior h/o cx LEEP  And h/o diagnostic L/S age 36 and was told there was no endometriosis   Past Medical History:  has a past medical history of Asthma without status asthmaticus, unspecified; Cervical dysplasia; History of anxiety; History of chickenpox; History of depression; History of ovarian cyst; History of palpitations; History of vitamin D deficiency; Hypothyroidism, unspecified; Irregular heartbeat; Migraine headache; and Other nonspecific abnormal finding (10/23/2007).  Past Surgical History:  has a past surgical history that includes Colposcopy with biopsy (11/15/2007); LEEP procedure (06/14/2008); Tonsillectomy; Wisdom tooth extraction; Laparoscopy ; and photorefractive keratotomy/lasik (Bilateral, 2015). Family History: family history includes No Known Problems in her father and mother. Social History:  reports that she has never smoked. She has never used smokeless tobacco. She reports that she does not drink alcohol or use drugs. OB/GYN History:          OB History    Gravida Para Term  Preterm AB Living   2 2 2     2    SAB TAB Ectopic Molar Multiple Live Births                    Allergies: is allergic to codeine sulfate and darvocet-n 100 [propoxyphene n-acetaminophen]. Medications:  Current Outpatient Prescriptions:  .  hydroCHLOROthiazide (HYDRODIURIL) 12.5 MG tablet, Take 12.5 mg by mouth once daily., Disp: , Rfl:  .  LEVOTHYROXINE SODIUM (SYNTHROID ORAL), Take 100 mcg by mouth once daily.  , Disp: , Rfl:   Review of Systems: General:                      No fatigue or weight loss Eyes:                           No vision changes Ears:                            No hearing difficulty Respiratory:                No cough or shortness of breath Pulmonary:                  No asthma or shortness of breath Cardiovascular:           No chest pain, palpitations, dyspnea on exertion Gastrointestinal:          No abdominal  bloating, chronic diarrhea, constipations, masses, pain or hematochezia Genitourinary:             No hematuria, dysuria, abnormal vaginal discharge, pelvic pain, Menometrorrhagia Lymphatic:                   No swollen lymph nodes Musculoskeletal:         No muscle weakness Neurologic:                  No extremity weakness, syncope, seizure disorder Psychiatric:                  No history of depression, delusions or suicidal/homicidal ideation    Exam:      Vitals:   01/20/17 0937  BP: 120/70  Pulse: 80    Body mass index is 27.81 kg/m.  WDWN white/  female in NAD   Lungs: CTA  CV : RRR without murmur    Neck:  no thyromegaly Abdomen: soft , no mass, normal active bowel sounds,  non-tender, no rebound tenderness Pelvic: tanner stage 5 ,  External genitalia: vulva /labia no lesions Urethra: no prolapse Vagina: normal physiologic d/c, adequate room for TVH / LAVH  Cervix: no lesions, no cervical motion tenderness   Uterus: normal size shape and contour, non-tender Adnexa: no mass,  non-tender     Impression:    The primary encounter diagnosis was Menometrorrhagia. Diagnoses of Dysmenorrhea and Chronic pelvic pain( R>L) in female were also pertinent to this visit. Possible adenomyosis . Pelvic pain is not classic for endometriosis , but is within the differential    Plan:   Pt has failed conservative tx for treatment of uterine bleeding and dysmenorrhea. I recommend definitive treatment with a LAVH / bilateral salpingectomy and  Right oophorectomy only if significant endometriosis is documented at the time of surgery . Pt is in agreement .  The risks of the procedure has been explained to the pt .    Caroline Sauger, MD

## 2017-04-28 ENCOUNTER — Encounter
Admission: RE | Admit: 2017-04-28 | Discharge: 2017-04-28 | Disposition: A | Discharge status: Home / Self Care | Payer: BC Managed Care – PPO | Source: Ambulatory Visit | Attending: Obstetrics and Gynecology | Admitting: Obstetrics and Gynecology

## 2017-04-28 DIAGNOSIS — N921 Excessive and frequent menstruation with irregular cycle: Secondary | ICD-10-CM | POA: Insufficient documentation

## 2017-04-28 DIAGNOSIS — N946 Dysmenorrhea, unspecified: Secondary | ICD-10-CM | POA: Diagnosis not present

## 2017-04-28 DIAGNOSIS — R102 Pelvic and perineal pain: Secondary | ICD-10-CM | POA: Insufficient documentation

## 2017-04-28 DIAGNOSIS — Z01818 Encounter for other preprocedural examination: Secondary | ICD-10-CM | POA: Insufficient documentation

## 2017-04-28 HISTORY — DX: Cardiac arrhythmia, unspecified: I49.9

## 2017-04-28 HISTORY — DX: Headache: R51

## 2017-04-28 HISTORY — DX: Hypothyroidism, unspecified: E03.9

## 2017-04-28 HISTORY — DX: Headache, unspecified: R51.9

## 2017-04-28 HISTORY — DX: Major depressive disorder, single episode, unspecified: F32.9

## 2017-04-28 HISTORY — DX: Anxiety disorder, unspecified: F41.9

## 2017-04-28 HISTORY — DX: Depression, unspecified: F32.A

## 2017-04-28 LAB — CBC
HEMATOCRIT: 41.4 % (ref 35.0–47.0)
HEMOGLOBIN: 14.5 g/dL (ref 12.0–16.0)
MCH: 29.9 pg (ref 26.0–34.0)
MCHC: 35 g/dL (ref 32.0–36.0)
MCV: 85.4 fL (ref 80.0–100.0)
Platelets: 422 10*3/uL (ref 150–440)
RBC: 4.85 MIL/uL (ref 3.80–5.20)
RDW: 12.8 % (ref 11.5–14.5)
WBC: 8.1 10*3/uL (ref 3.6–11.0)

## 2017-04-28 LAB — BASIC METABOLIC PANEL
ANION GAP: 9 (ref 5–15)
BUN: 10 mg/dL (ref 6–20)
CO2: 28 mmol/L (ref 22–32)
Calcium: 9.5 mg/dL (ref 8.9–10.3)
Chloride: 101 mmol/L (ref 101–111)
Creatinine, Ser: 0.59 mg/dL (ref 0.44–1.00)
GFR calc Af Amer: >60 mL/min (ref >60)
GFR calc non Af Amer: >60 mL/min (ref >60)
GLUCOSE: 88 mg/dL (ref 65–99)
POTASSIUM: 3.1 mmol/L — AB (ref 3.5–5.1)
Sodium: 138 mmol/L (ref 135–145)

## 2017-04-28 LAB — TYPE AND SCREEN
ABO/RH(D): A POS
Antibody Screen: NEGATIVE

## 2017-04-28 NOTE — Patient Instructions (Addendum)
  Your procedure is scheduled on: 05/02/17 Report to Same Day Surgery 2nd floor medical mall [facility] Entrance-take elevator on left to 2nd floor.  Check in with surgery information desk.) To find out your arrival time please call [phone] between 1PM - 3PM on 04/29/17  Remember: Instructions that are not followed completely may result in serious medical risk, up to and including death, or upon the discretion of your surgeon and anesthesiologist your surgery may need to be rescheduled.    _x___ 1. Do not eat food or drink liquids after midnight. No gum chewing or                              hard candies.     ____ 2. No Alcohol for 24 hours before or after surgery.   ____3. No Smoking for 24 prior to surgery.   ____  4. Bring all medications with you on the day of surgery if instructed.    __x__ 5. Notify your doctor if there is any change in your medical condition     (cold, fever, infections).     Do not wear jewelry, make-up, hairpins, clips or nail polish.  Do not wear lotions, powders, or perfumes. You may wear deodorant.  Do not shave 48 hours prior to surgery. Men may shave face and neck.  Do not bring valuables to the hospital.    [facility] is not responsible for any belongings or valuables.               Contacts, dentures or bridgework may not be worn into surgery.  Leave your suitcase in the car. After surgery it may be brought to your room.  For patients admitted to the hospital, discharge time is determined by your                       treatment team.   Patients discharged the day of surgery will not be allowed to drive home.  You will need someone to drive you home and stay with you the night of your procedure.    Please read over the following fact sheets that you were given:   [facility] Preparing for Surgery and or MRSA Information   _x___ Take anti-hypertensive (unless it includes a diuretic), cardiac, seizure, asthma,     anti-reflux and psychiatric  medicines. These include:  1. LEVOTHYROXINE  2.  3.  4.  5.  6.  _X___Fleets enema or Magnesium Citrate as directed.  [place] DAY OF SURGERY _x___ Use CHG Soap or sage wipes as directed on instruction sheet   ____ Use inhalers on the day of surgery and bring to hospital day of surgery  ____ Stop Metformin and Janumet 2 days prior to surgery.    ____ Take 1/2 of usual insulin dose the night before surgery and none on the morning     surgery.   ____ Follow recommendations from Cardiologist, Pulmonologist or PCP regarding stopping Aspirin, Coumadin, Pllavix ,Eliquis, Effient, or Pradaxa, and Pletal.  X____Stop Anti-inflammatories such as Advil, Aleve, Ibuprofen, Motrin, Naproxen, Naprosyn, Goodies powders or aspirin products. OK to take Tylenol and   Celebrex. STOP ADVIL UNTIL AFTER SURGERY  ____ Stop supplements until after surgery.  But may continue Vitamin D, Vitamin B,       and multivitamin.   ____ Bring C-Pap to the hospital.

## 2017-04-28 NOTE — Pre-Procedure Instructions (Signed)
Met B results sent to Dr. Schermerhorn and Anesthesia for review. 

## 2017-05-01 MED ORDER — CEFOXITIN SODIUM-DEXTROSE 2-2.2 GM-% IV SOLR (PREMIX): 2.0000 g | INTRAVENOUS | Status: AC

## 2017-05-02 ENCOUNTER — Observation Stay
Admission: RE | Admit: 2017-05-02 | Discharge: 2017-05-03 | Disposition: A | Discharge status: Home / Self Care | Payer: BC Managed Care – PPO | Source: Ambulatory Visit | Attending: Obstetrics and Gynecology | Admitting: Obstetrics and Gynecology

## 2017-05-02 ENCOUNTER — Encounter: Payer: Self-pay | Admitting: *Deleted

## 2017-05-02 ENCOUNTER — Ambulatory Visit: Payer: BC Managed Care – PPO | Admitting: Registered Nurse

## 2017-05-02 ENCOUNTER — Encounter
Admission: RE | Disposition: A | Discharge status: Home / Self Care | Payer: Self-pay | Source: Ambulatory Visit | Attending: Obstetrics and Gynecology

## 2017-05-02 DIAGNOSIS — Z79899 Other long term (current) drug therapy: Secondary | ICD-10-CM | POA: Insufficient documentation

## 2017-05-02 DIAGNOSIS — D252 Subserosal leiomyoma of uterus: Secondary | ICD-10-CM | POA: Diagnosis not present

## 2017-05-02 DIAGNOSIS — J45909 Unspecified asthma, uncomplicated: Secondary | ICD-10-CM | POA: Insufficient documentation

## 2017-05-02 DIAGNOSIS — E039 Hypothyroidism, unspecified: Secondary | ICD-10-CM | POA: Insufficient documentation

## 2017-05-02 DIAGNOSIS — Z9889 Other specified postprocedural states: Secondary | ICD-10-CM

## 2017-05-02 DIAGNOSIS — N870 Mild cervical dysplasia: Principal | ICD-10-CM | POA: Insufficient documentation

## 2017-05-02 DIAGNOSIS — G8929 Other chronic pain: Secondary | ICD-10-CM | POA: Diagnosis not present

## 2017-05-02 HISTORY — PX: LAPAROSCOPIC VAGINAL HYSTERECTOMY WITH SALPINGECTOMY: SHX6680

## 2017-05-02 LAB — POCT I-STAT 4, (NA,K, GLUC, HGB,HCT)
GLUCOSE: 90 mg/dL (ref 65–99)
HCT: 37 % (ref 36.0–46.0)
Hemoglobin: 12.6 g/dL (ref 12.0–15.0)
Potassium: 3.8 mmol/L (ref 3.5–5.1)
Sodium: 140 mmol/L (ref 135–145)

## 2017-05-02 LAB — POCT PREGNANCY, URINE: PREG TEST UR: NEGATIVE

## 2017-05-02 LAB — ABO/RH: ABO/RH(D): A POS

## 2017-05-02 SURGERY — HYSTERECTOMY, VAGINAL, LAPAROSCOPY-ASSISTED, WITH SALPINGECTOMY
Anesthesia: General | Laterality: Bilateral

## 2017-05-02 MED ORDER — SUCCINYLCHOLINE CHLORIDE 20 MG/ML IJ SOLN
INTRAMUSCULAR | Status: AC
  Filled 2017-05-02: qty 1

## 2017-05-02 MED ORDER — KETOROLAC TROMETHAMINE 30 MG/ML IJ SOLN
INTRAMUSCULAR | Status: AC
  Filled 2017-05-02: qty 1

## 2017-05-02 MED ORDER — ROCURONIUM BROMIDE 100 MG/10ML IV SOLN
INTRAVENOUS | Status: DC | PRN
  Administered 2017-05-02: 10 mg via INTRAVENOUS
  Administered 2017-05-02: 40 mg via INTRAVENOUS

## 2017-05-02 MED ORDER — ONDANSETRON HCL 4 MG/2ML IJ SOLN
INTRAMUSCULAR | Status: DC | PRN
  Administered 2017-05-02: 4 mg via INTRAVENOUS

## 2017-05-02 MED ORDER — ACETAMINOPHEN 10 MG/ML IV SOLN
INTRAVENOUS | Status: AC
  Filled 2017-05-02: qty 100

## 2017-05-02 MED ORDER — LIDOCAINE-EPINEPHRINE 1 %-1:100000 IJ SOLN
INTRAMUSCULAR | Status: DC | PRN
  Administered 2017-05-02: 10 mL

## 2017-05-02 MED ORDER — ONDANSETRON HCL 4 MG PO TABS: 4.0000 mg | ORAL_TABLET | Freq: Four times a day (QID) | ORAL | Status: DC | PRN

## 2017-05-02 MED ORDER — HYDROMORPHONE HCL 1 MG/ML IJ SOLN
INTRAMUSCULAR | Status: DC | PRN
  Administered 2017-05-02 (×2): .4 mg via INTRAVENOUS
  Administered 2017-05-02: .2 mg via INTRAVENOUS

## 2017-05-02 MED ORDER — CEFOXITIN SODIUM-DEXTROSE 2-2.2 GM-% IV SOLR (PREMIX)
INTRAVENOUS | Status: AC
  Filled 2017-05-02: qty 50

## 2017-05-02 MED ORDER — KETOROLAC TROMETHAMINE 30 MG/ML IJ SOLN
30.0000 mg | Freq: Three times a day (TID) | INTRAMUSCULAR | Status: DC | PRN
  Administered 2017-05-02 – 2017-05-03 (×3): 30 mg via INTRAVENOUS
  Filled 2017-05-02 (×4): qty 1

## 2017-05-02 MED ORDER — ONDANSETRON HCL 4 MG/2ML IJ SOLN
INTRAMUSCULAR | Status: AC
  Filled 2017-05-02: qty 2

## 2017-05-02 MED ORDER — DEXAMETHASONE SODIUM PHOSPHATE 10 MG/ML IJ SOLN
INTRAMUSCULAR | Status: DC | PRN
  Administered 2017-05-02: 5 mg via INTRAVENOUS

## 2017-05-02 MED ORDER — OXYCODONE-ACETAMINOPHEN 5-325 MG PO TABS
1.0000 | ORAL_TABLET | ORAL | Status: DC | PRN
  Administered 2017-05-02 – 2017-05-03 (×4): 1 via ORAL
  Administered 2017-05-03: 2 via ORAL
  Administered 2017-05-03: 1 via ORAL
  Filled 2017-05-02 (×2): qty 1
  Filled 2017-05-02: qty 2
  Filled 2017-05-02 (×3): qty 1

## 2017-05-02 MED ORDER — EPHEDRINE SULFATE 50 MG/ML IJ SOLN
INTRAMUSCULAR | Status: AC
  Filled 2017-05-02: qty 1

## 2017-05-02 MED ORDER — BUPIVACAINE HCL 0.5 % IJ SOLN
INTRAMUSCULAR | Status: DC | PRN
  Administered 2017-05-02: 10 mL

## 2017-05-02 MED ORDER — SIMETHICONE 80 MG PO CHEW
80.0000 mg | CHEWABLE_TABLET | Freq: Four times a day (QID) | ORAL | Status: DC | PRN
  Administered 2017-05-02: 80 mg via ORAL
  Filled 2017-05-02 (×2): qty 1

## 2017-05-02 MED ORDER — ONDANSETRON HCL 4 MG/2ML IJ SOLN
4.0000 mg | Freq: Four times a day (QID) | INTRAMUSCULAR | Status: DC | PRN
  Administered 2017-05-02: 4 mg via INTRAVENOUS
  Filled 2017-05-02: qty 2

## 2017-05-02 MED ORDER — LIDOCAINE-EPINEPHRINE 1 %-1:100000 IJ SOLN
INTRAMUSCULAR | Status: AC
  Filled 2017-05-02: qty 1

## 2017-05-02 MED ORDER — PROPOFOL 10 MG/ML IV BOLUS
INTRAVENOUS | Status: AC
  Filled 2017-05-02: qty 20

## 2017-05-02 MED ORDER — MIDAZOLAM HCL 2 MG/2ML IJ SOLN
INTRAMUSCULAR | Status: AC
  Filled 2017-05-02: qty 2

## 2017-05-02 MED ORDER — PROMETHAZINE HCL 25 MG/ML IJ SOLN
6.2500 mg | INTRAMUSCULAR | Status: DC | PRN
  Administered 2017-05-02: 12.5 mg via INTRAVENOUS

## 2017-05-02 MED ORDER — SODIUM CHLORIDE 0.9 % IJ SOLN
INTRAMUSCULAR | Status: AC
  Filled 2017-05-02: qty 10

## 2017-05-02 MED ORDER — DEXAMETHASONE SODIUM PHOSPHATE 10 MG/ML IJ SOLN
INTRAMUSCULAR | Status: AC
  Filled 2017-05-02: qty 1

## 2017-05-02 MED ORDER — LACTATED RINGERS IV SOLN
INTRAVENOUS | Status: DC
  Administered 2017-05-02: 07:00:00 via INTRAVENOUS

## 2017-05-02 MED ORDER — FENTANYL CITRATE (PF) 100 MCG/2ML IJ SOLN
25.0000 ug | INTRAMUSCULAR | Status: DC | PRN
  Administered 2017-05-02 (×3): 50 ug via INTRAVENOUS

## 2017-05-02 MED ORDER — FAMOTIDINE 20 MG PO TABS
ORAL_TABLET | ORAL | Status: AC
  Administered 2017-05-02: 20 mg via ORAL
  Filled 2017-05-02: qty 1

## 2017-05-02 MED ORDER — ACETAMINOPHEN 10 MG/ML IV SOLN
INTRAVENOUS | Status: DC | PRN
  Administered 2017-05-02: 1000 mg via INTRAVENOUS

## 2017-05-02 MED ORDER — PROMETHAZINE HCL 25 MG/ML IJ SOLN
INTRAMUSCULAR | Status: AC
  Filled 2017-05-02: qty 1

## 2017-05-02 MED ORDER — BUPIVACAINE HCL (PF) 0.5 % IJ SOLN
INTRAMUSCULAR | Status: AC
  Filled 2017-05-02: qty 30

## 2017-05-02 MED ORDER — PHENYLEPHRINE HCL 10 MG/ML IJ SOLN
INTRAMUSCULAR | Status: DC | PRN
  Administered 2017-05-02: 50 ug via INTRAVENOUS

## 2017-05-02 MED ORDER — MIDAZOLAM HCL 2 MG/2ML IJ SOLN
INTRAMUSCULAR | Status: DC | PRN
  Administered 2017-05-02: 2 mg via INTRAVENOUS

## 2017-05-02 MED ORDER — FENTANYL CITRATE (PF) 100 MCG/2ML IJ SOLN
INTRAMUSCULAR | Status: AC
  Filled 2017-05-02: qty 2

## 2017-05-02 MED ORDER — LACTATED RINGERS IV SOLN: INTRAVENOUS | Status: DC

## 2017-05-02 MED ORDER — LIDOCAINE HCL (CARDIAC) 20 MG/ML IV SOLN
INTRAVENOUS | Status: DC | PRN
  Administered 2017-05-02: 60 mg via INTRAVENOUS

## 2017-05-02 MED ORDER — SUGAMMADEX SODIUM 200 MG/2ML IV SOLN
INTRAVENOUS | Status: AC
  Filled 2017-05-02: qty 2

## 2017-05-02 MED ORDER — FENTANYL CITRATE (PF) 100 MCG/2ML IJ SOLN
INTRAMUSCULAR | Status: DC | PRN
  Administered 2017-05-02: 100 ug via INTRAVENOUS

## 2017-05-02 MED ORDER — SUGAMMADEX SODIUM 200 MG/2ML IV SOLN
INTRAVENOUS | Status: DC | PRN
  Administered 2017-05-02: 140.6 mg via INTRAVENOUS

## 2017-05-02 MED ORDER — MORPHINE SULFATE (PF) 4 MG/ML IV SOLN
1.0000 mg | INTRAVENOUS | Status: DC | PRN
  Administered 2017-05-02: 1 mg via INTRAVENOUS
  Filled 2017-05-02: qty 1

## 2017-05-02 MED ORDER — HYDROMORPHONE HCL 1 MG/ML IJ SOLN
INTRAMUSCULAR | Status: AC
  Filled 2017-05-02: qty 1

## 2017-05-02 MED ORDER — PHENYLEPHRINE HCL 10 MG/ML IJ SOLN
INTRAMUSCULAR | Status: AC
  Filled 2017-05-02: qty 1

## 2017-05-02 MED ORDER — PROPOFOL 10 MG/ML IV BOLUS
INTRAVENOUS | Status: DC | PRN
  Administered 2017-05-02: 160 mg via INTRAVENOUS

## 2017-05-02 MED ORDER — CEFOXITIN SODIUM 2 G IV SOLR
INTRAVENOUS | Status: DC | PRN
  Administered 2017-05-02: 2 g via INTRAVENOUS

## 2017-05-02 MED ORDER — LIDOCAINE HCL (PF) 2 % IJ SOLN
INTRAMUSCULAR | Status: AC
  Filled 2017-05-02: qty 2

## 2017-05-02 MED ORDER — MEPERIDINE HCL 100 MG/ML IJ SOLN
75.0000 mg | INTRAMUSCULAR | Status: DC | PRN
Start: 2017-05-02 — End: 2017-05-02

## 2017-05-02 MED ORDER — LACTATED RINGERS IV SOLN
INTRAVENOUS | Status: DC
  Administered 2017-05-02 (×2): via INTRAVENOUS

## 2017-05-02 MED ORDER — KETOROLAC TROMETHAMINE 30 MG/ML IJ SOLN
INTRAMUSCULAR | Status: DC | PRN
  Administered 2017-05-02: 30 mg via INTRAVENOUS

## 2017-05-02 MED ORDER — MEPERIDINE HCL 100 MG/ML IJ SOLN: 75.0000 mg | INTRAMUSCULAR | Status: DC | PRN

## 2017-05-02 MED ORDER — EPHEDRINE SULFATE 50 MG/ML IJ SOLN
INTRAMUSCULAR | Status: DC | PRN
  Administered 2017-05-02 (×2): 5 mg via INTRAVENOUS

## 2017-05-02 MED ORDER — FLEET ENEMA 7-19 GM/118ML RE ENEM: 1.0000 | ENEMA | Freq: Once | RECTAL | Status: DC

## 2017-05-02 MED ORDER — ROCURONIUM BROMIDE 50 MG/5ML IV SOLN
INTRAVENOUS | Status: AC
Start: 2017-05-02 — End: 2017-05-02
  Filled 2017-05-02: qty 1

## 2017-05-02 MED ORDER — FAMOTIDINE 20 MG PO TABS
20.0000 mg | ORAL_TABLET | Freq: Once | ORAL | Status: AC
  Administered 2017-05-02: 20 mg via ORAL

## 2017-05-02 SURGICAL SUPPLY — 46 items
BAG URO DRAIN 2000ML W/SPOUT (MISCELLANEOUS) ×3 IMPLANT
BLADE SURG SZ11 CARB STEEL (BLADE) ×3 IMPLANT
CATH FOLEY 2WAY  5CC 16FR (CATHETERS) ×2
CATH URTH 16FR FL 2W BLN LF (CATHETERS) ×1 IMPLANT
CHLORAPREP W/TINT 26ML (MISCELLANEOUS) ×3 IMPLANT
CLOSURE WOUND 1/2 X4 (GAUZE/BANDAGES/DRESSINGS) ×1
DERMABOND ADVANCED (GAUZE/BANDAGES/DRESSINGS) ×2
DERMABOND ADVANCED .7 DNX12 (GAUZE/BANDAGES/DRESSINGS) ×1 IMPLANT
DRAPE SURG 17X11 SM STRL (DRAPES) ×3 IMPLANT
DRSG TEGADERM 2X2.25 PEDS (GAUZE/BANDAGES/DRESSINGS) ×3 IMPLANT
ELECT REM PT RETURN 9FT ADLT (ELECTROSURGICAL) ×3
ELECTRODE REM PT RTRN 9FT ADLT (ELECTROSURGICAL) ×1 IMPLANT
FILTER LAP SMOKE EVAC STRL (MISCELLANEOUS) ×3 IMPLANT
GLOVE BIO SURGEON STRL SZ8 (GLOVE) ×12 IMPLANT
GOWN STRL REUS W/ TWL LRG LVL3 (GOWN DISPOSABLE) ×3 IMPLANT
GOWN STRL REUS W/ TWL XL LVL3 (GOWN DISPOSABLE) ×3 IMPLANT
GOWN STRL REUS W/TWL LRG LVL3 (GOWN DISPOSABLE) ×6
GOWN STRL REUS W/TWL XL LVL3 (GOWN DISPOSABLE) ×6
GRASPER SUT TROCAR 14GX15 (MISCELLANEOUS) ×3 IMPLANT
HANDLE YANKAUER SUCT BULB TIP (MISCELLANEOUS) ×3 IMPLANT
IRRIGATION STRYKERFLOW (MISCELLANEOUS) ×1 IMPLANT
IRRIGATOR STRYKERFLOW (MISCELLANEOUS) ×3
KIT PINK PAD W/HEAD ARE REST (MISCELLANEOUS) ×3
KIT PINK PAD W/HEAD ARM REST (MISCELLANEOUS) ×1 IMPLANT
KIT RM TURNOVER CYSTO AR (KITS) ×3 IMPLANT
LABEL OR SOLS (LABEL) ×3 IMPLANT
NEEDLE HYPO 22GX1.5 SAFETY (NEEDLE) ×3 IMPLANT
PACK BASIN MINOR ARMC (MISCELLANEOUS) ×3 IMPLANT
PACK GYN LAPAROSCOPIC (MISCELLANEOUS) ×3 IMPLANT
PAD OB MATERNITY 4.3X12.25 (PERSONAL CARE ITEMS) ×3 IMPLANT
SCISSORS METZENBAUM CVD 33 (INSTRUMENTS) ×3 IMPLANT
SHEARS HARMONIC ACE PLUS 36CM (ENDOMECHANICALS) ×3 IMPLANT
SLEEVE ENDOPATH XCEL 5M (ENDOMECHANICALS) ×3 IMPLANT
SPONGE XRAY 4X4 16PLY STRL (MISCELLANEOUS) IMPLANT
STRIP CLOSURE SKIN 1/2X4 (GAUZE/BANDAGES/DRESSINGS) ×2 IMPLANT
SUT VIC AB 0 CT1 27 (SUTURE) ×2
SUT VIC AB 0 CT1 27XCR 8 STRN (SUTURE) ×1 IMPLANT
SUT VIC AB 0 CT1 36 (SUTURE) ×6 IMPLANT
SUT VIC AB 0 CT2 27 (SUTURE) ×3 IMPLANT
SUT VIC AB 4-0 SH 27 (SUTURE)
SUT VIC AB 4-0 SH 27XANBCTRL (SUTURE) IMPLANT
SYR CONTROL 10ML (SYRINGE) ×3 IMPLANT
SYRINGE 10CC LL (SYRINGE) ×3 IMPLANT
TROCAR ENDO BLADELESS 11MM (ENDOMECHANICALS) ×3 IMPLANT
TROCAR XCEL NON-BLD 5MMX100MML (ENDOMECHANICALS) ×3 IMPLANT
TUBING INSUF HEATED (TUBING) ×3 IMPLANT

## 2017-05-02 NOTE — Anesthesia Preprocedure Evaluation (Signed)
Anesthesia Evaluation  Patient identified by MRN, date of birth, ID band Patient awake    Reviewed: Allergy & Precautions, H&P , NPO status , Patient's Chart, lab work & pertinent test results, reviewed documented beta blocker date and time   History of Anesthesia Complications Negative for: history of anesthetic complications  Airway Mallampati: I  TM Distance: >3 FB Neck ROM: full    Dental  (+) Caps, Dental Advidsory Given, Teeth Intact, Missing   Pulmonary neg shortness of breath, asthma , neg sleep apnea, neg COPD, neg recent URI,           Cardiovascular Exercise Tolerance: Good (-) hypertension(-) angina(-) CAD, (-) Past MI, (-) Cardiac Stents and (-) CABG + dysrhythmias (history of, none currently) (-) Valvular Problems/Murmurs     Neuro/Psych PSYCHIATRIC DISORDERS (Depression and anxiety) negative neurological ROS     GI/Hepatic negative GI ROS, Neg liver ROS,   Endo/Other  neg diabetesHypothyroidism   Renal/GU negative Renal ROS  negative genitourinary   Musculoskeletal   Abdominal   Peds  Hematology negative hematology ROS (+)   Anesthesia Other Findings Past Medical History: No date: Anxiety No date: Asthma No date: Depression No date: Dysrhythmia     Comment: DUE TO THYROID No date: Headache     Comment: MIGRAINS/ H/O No date: Hypothyroidism   Reproductive/Obstetrics negative OB ROS                             Anesthesia Physical Anesthesia Plan  ASA: II  Anesthesia Plan: General   Post-op Pain Management:    Induction:   PONV Risk Score and Plan: 3 and Ondansetron, Dexamethasone, Propofol and Midazolam  Airway Management Planned:   Additional Equipment:   Intra-op Plan:   Post-operative Plan:   Informed Consent: I have reviewed the patients History and Physical, chart, labs and discussed the procedure including the risks, benefits and alternatives for the  proposed anesthesia with the patient or authorized representative who has indicated his/her understanding and acceptance.   Dental Advisory Given  Plan Discussed with: Anesthesiologist, CRNA and Surgeon  Anesthesia Plan Comments:         Anesthesia Quick Evaluation

## 2017-05-02 NOTE — Brief Op Note (Signed)
05/02/2017  9:26 AM  PATIENT:  Kimberly Mcbride  36 y.o. female  PRE-OPERATIVE DIAGNOSIS:  Menometorrhagia  Dysmenorrhea  Chronic Pelvic Pain  POST-OPERATIVE DIAGNOSIS:  Same as above  , mild left side wall adhesions  PROCEDURE:  Procedure(s): LAPAROSCOPIC ASSISTED VAGINAL HYSTERECTOMY WITH SALPINGECTOMY (Bilateral)  SURGEON:  Surgeon(s) and Role:    * Jaisen Wiltrout, Gwen Her, MD - Primary    * Ward, Honor Loh, MD  PHYSICIAN ASSISTANT:   ASSISTANTS: none   ANESTHESIA:   general  EBL:  Total I/O In: 700 [I.V.:700] Out: 225 [Urine:200; Blood:25]  BLOOD ADMINISTERED:none  DRAINS: Urinary Catheter (Foley)   LOCAL MEDICATIONS USED:  LIDOCAINE   SPECIMEN:  Source of Specimen:  uterus , cervix , bilateral fallopian tubes   DISPOSITION OF SPECIMEN:  PATHOLOGY  COUNTS:  YES  TOURNIQUET:  * No tourniquets in log *  DICTATION: .Other Dictation: Dictation Number verbal  PLAN OF CARE: Admit for overnight observation  PATIENT DISPOSITION:  PACU - hemodynamically stable.   Delay start of Pharmacological VTE agent (>24hrs) due to surgical blood loss or risk of bleeding: not applicable

## 2017-05-02 NOTE — Anesthesia Postprocedure Evaluation (Signed)
Anesthesia Post Note  Patient: Kimberly Mcbride  Procedure(s) Performed: Procedure(s) (LRB): LAPAROSCOPIC ASSISTED VAGINAL HYSTERECTOMY WITH SALPINGECTOMY (Bilateral)  Patient location during evaluation: PACU Anesthesia Type: General Level of consciousness: awake and alert Pain management: pain level controlled Vital Signs Assessment: post-procedure vital signs reviewed and stable Respiratory status: spontaneous breathing, nonlabored ventilation, respiratory function stable and patient connected to nasal cannula oxygen Cardiovascular status: blood pressure returned to baseline and stable Postop Assessment: no signs of nausea or vomiting Anesthetic complications: no     Last Vitals:  Vitals:   05/02/17 1124 05/02/17 1255  BP: 102/60 108/62  Pulse: 73 94  Resp: 18 18  Temp: 36.7 C 36.7 C    Last Pain:  Vitals:   05/02/17 1300  TempSrc:   PainSc: 6                  Martha Clan

## 2017-05-02 NOTE — Progress Notes (Signed)
Patient ID: Kimberly Mcbride, female   DOB: 1980-12-26, 36 y.o.   MRN: [id number] DOS from LAVH , [place] and bilat salpingectomy .  Pain ok  Foley is bothering her . Good Urine output  Good po intake  O: VSS Abd non distended  A: stable  P: d/c foley  Slow IV rate  D/c in am

## 2017-05-02 NOTE — Anesthesia Procedure Notes (Signed)
Procedure Name: Intubation Date/Time: 05/02/2017 7:41 AM Performed by: Hedda Slade Pre-anesthesia Checklist: Patient identified, Patient being monitored, Timeout performed, Emergency Drugs available and Suction available Patient Re-evaluated:Patient Re-evaluated prior to inductionOxygen Delivery Method: Circle system utilized Preoxygenation: Pre-oxygenation with 100% oxygen Intubation Type: IV induction Ventilation: Mask ventilation without difficulty and Oral airway inserted - appropriate to patient size Laryngoscope Size: Mac and 3 Grade View: Grade I Tube size: 7.0 mm Number of attempts: 1 Airway Equipment and Method: Stylet Placement Confirmation: ETT inserted through vocal cords under direct vision,  positive ETCO2 and breath sounds checked- equal and bilateral Secured at: 21 cm Tube secured with: Tape Dental Injury: Teeth and Oropharynx as per pre-operative assessment

## 2017-05-02 NOTE — Progress Notes (Signed)
hcg negative ,  K+ normal   Scheduled got LAVH , bilateral salpingectomy , possible right oophorectomy if pathology is found.  NPO  Ready for surgery . All questions answered

## 2017-05-02 NOTE — Transfer of Care (Signed)
Immediate Anesthesia Transfer of Care Note  Patient: Kimberly Mcbride  Procedure(s) Performed: Procedure(s): LAPAROSCOPIC ASSISTED VAGINAL HYSTERECTOMY WITH SALPINGECTOMY (Bilateral)  Patient Location: PACU  Anesthesia Type:General  Level of Consciousness: sedated  Airway & Oxygen Therapy: Patient Spontanous Breathing and Patient connected to face mask oxygen  Post-op Assessment: Report given to RN and Post -op Vital signs reviewed and stable  Post vital signs: Reviewed and stable  Last Vitals:  Vitals:   05/02/17 0622 05/02/17 0945  BP: 109/79 98/61  Pulse: 80 65  Resp: 16 11  Temp: 36.6 C 36.2 C    Last Pain:  Vitals:   05/02/17 0622  TempSrc: Oral  PainSc: 2          Complications: No apparent anesthesia complications

## 2017-05-02 NOTE — Anesthesia Post-op Follow-up Note (Cosign Needed)
Anesthesia QCDR form completed.        

## 2017-05-02 NOTE — Op Note (Signed)
NAME:  Kimberly Mcbride, Kimberly Mcbride                ACCOUNT NO.:  [id number]  MEDICAL RECORD NO.:  [id number]  LOCATION:  [facility]                         FACILITY:  [facility]  PHYSICIAN:  Laverta Baltimore, MDDATE OF BIRTH:  Mar 29, 1981  DATE OF PROCEDURE:  05/02/2017 DATE OF DISCHARGE:                              OPERATIVE REPORT   PREOPERATIVE DIAGNOSIS: 1. Menorrhagia. 2. Severe dysmenorrhea. 3. Dyspareunia. 4. Chronic pelvic pain, right pelvic greater than left.  POSTOPERATIVE DIAGNOSIS: 1. Menorrhagia. 2. Severe dysmenorrhea. 3. Chronic pelvic pain. 4. Dyspareunia. 5. Mild left sidewall pelvic adhesions.  PROCEDURE PERFORMED: 1. Laparoscopic-assisted vaginal hysterectomy. 2. Bilateral salpingectomy. 3. Pelvic adhesiolysis.  SURGEON:  Laverta Baltimore, MD  ANESTHESIA:  General endotracheal anesthesia.  SURGEON:  Laverta Baltimore, MD.  FIRST ASSISTANT:  Honor Loh Ward, MD.  INDICATION:  A 36 year old gravida 2, para 2 patient with long history of chronic pelvic pain, dysmenorrhea, dyspareunia, and heavy bleeding. The patient's pain is preferentially on the right side.  FINDINGS:  Pelvis appeared generally without scar tissue or evidence of endometriosis.  There were some small colonic adhesions to the left sidewall and left adnexal complex.  DESCRIPTION OF PROCEDURE:  After adequate general endotracheal anesthesia, the patient was placed in dorsal supine position with the legs in the [place] stirrups.  The patient's abdomen was prepped and draped in sterile fashion.  She did receive 2 g IV cefoxitin prior to commencement of the case.  Time-out was performed.  A red Robinson catheter was used to drain the bladder, which yielded 100 mL clear urine.  Gloves were changed and a 15 mm infraumbilical incision was made after injecting with 0.5% Marcaine.  The laparoscope was advanced to the abdominal cavity under direct visualization with the Optiview cannula. The patient's abdomen was  insufflated with carbon dioxide.  Second port was placed in the left lower quadrant, 3 cm medial to the left anterior iliac spine.  A 5 mm port was advanced under direct visualization. Similar procedure was repeated on the patient's right side, 5 mm port advanced 3 cm medial to the right anterior iliac spine under direct visualization.  The patient was placed in Trendelenburg.  Initial impression revealed minimal scar tissue.  There was some mild scar tissue of the descending colon, sigmoid colon to the left sidewall.  The right ovary, fallopian tube, and ovarian fossa were all free.  There was no evidence of endometriosis.  Posterior cul-de-sac appeared normal as well.  Harmonic scalp was brought up and the left colonic adhesions were taken down without difficulty.  The patient's left fallopian tube was then grasped with the fimbriated end and the harmonic scalp was used to dissect the fallopian tube free from the mesosalpinx.  Left cornu was grasped and the broad ligament was incised to the level of the left uterine artery, which was cauterized and transected.  Bladder flap was created to the midline.  Of note, both ureters were identified prior to harmonic use with normal peristaltic activity.  Similar procedure was repeated on the patient's right side.  Fimbriated end of the fallopian tube was grasped and the fallopian tube was dissected free from the mesosalpinx and the broad ligament was incised and the  right uterine artery was identified, cauterized, and transected.  The bladder flap was connected to the midline.  Attention was then directed vaginally.  The patient's cervix was circumferentially injected with 1% lidocaine with epinephrine.  Two thyroid tenacula were used on the anterior and posterior cervix.  The posterior cul-de-sac was entered sharply. Uterosacral ligaments were bilaterally clamped, transected, and suture ligated, used for later identification.  The cervix was  then circumferentially incised with the Bovie and the anterior cul-de-sac was entered without difficulty.  Cardinal ligaments were clamped bilaterally, transected, and the uterus and fallopian tubes were delivered.  Good hemostasis was noted and the vaginal cuff was closed with a running 0 Vicryl suture.  Good hemostasis noted.  Gloves were changed again and attention was directed to the abdomen via the laparoscope.  There was good hemostasis noted intra-abdominally. Irrigation was performed.  Intraabdominal pressure was lowered to 7 mmHg and again no abnormality and no bleeding was identified.  Upper abdomen and the appendix appeared normal.  The infraumbilical incision was closed with a figure-of-eight 2-0 Vicryl suture and all skin incisions were closed with an interrupted 4-0 Vicryl suture and Tegaderm dressings were applied.  There were no complications.  Estimated blood loss 25 mL. Foley was placed at the end of the case yielding additional 100 mL, therefore urine output was 200 mL and intraoperative fluid 700 mL.  The patient did receive 30 mg intravenous Toradol at the end of the case. The patient was taken to recovery room in good condition.          ______________________________ Laverta Baltimore, MD     TS/MEDQ  D:  05/02/2017  T:  05/02/2017  Job:  [id number]

## 2017-05-03 ENCOUNTER — Encounter: Payer: Self-pay | Admitting: Obstetrics and Gynecology

## 2017-05-03 DIAGNOSIS — N870 Mild cervical dysplasia: Secondary | ICD-10-CM | POA: Diagnosis not present

## 2017-05-03 LAB — CBC
HCT: 38.5 % (ref 35.0–47.0)
Hemoglobin: 13.4 g/dL (ref 12.0–16.0)
MCH: 30.3 pg (ref 26.0–34.0)
MCHC: 34.8 g/dL (ref 32.0–36.0)
MCV: 87.2 fL (ref 80.0–100.0)
PLATELETS: 353 10*3/uL (ref 150–440)
RBC: 4.42 MIL/uL (ref 3.80–5.20)
RDW: 13 % (ref 11.5–14.5)
WBC: 16.1 10*3/uL — AB (ref 3.6–11.0)

## 2017-05-03 LAB — BASIC METABOLIC PANEL
ANION GAP: 6 (ref 5–15)
BUN: 9 mg/dL (ref 6–20)
CALCIUM: 9 mg/dL (ref 8.9–10.3)
CO2: 26 mmol/L (ref 22–32)
Chloride: 106 mmol/L (ref 101–111)
Creatinine, Ser: 0.54 mg/dL (ref 0.44–1.00)
GFR calc Af Amer: >60 mL/min (ref >60)
GLUCOSE: 113 mg/dL — AB (ref 65–99)
Potassium: 3.6 mmol/L (ref 3.5–5.1)
Sodium: 138 mmol/L (ref 135–145)

## 2017-05-03 LAB — SURGICAL PATHOLOGY

## 2017-05-03 MED ORDER — ONDANSETRON HCL 4 MG PO TABS: 4.0000 mg | ORAL_TABLET | Freq: Four times a day (QID) | ORAL | 0 refills | Status: DC | PRN

## 2017-05-03 MED ORDER — OXYCODONE-ACETAMINOPHEN 5-325 MG PO TABS: 1.0000 | ORAL_TABLET | ORAL | 0 refills | Status: DC | PRN

## 2017-05-03 MED ORDER — IBUPROFEN 800 MG PO TABS: 800.0000 mg | ORAL_TABLET | Freq: Three times a day (TID) | ORAL | 0 refills | Status: DC | PRN

## 2017-05-03 MED ORDER — IBUPROFEN 600 MG PO TABS: 600.0000 mg | ORAL_TABLET | Freq: Four times a day (QID) | ORAL | Status: DC | PRN

## 2017-05-03 MED ORDER — DOCUSATE SODIUM 100 MG PO CAPS: 100.0000 mg | ORAL_CAPSULE | Freq: Every day | ORAL | 2 refills | Status: AC | PRN

## 2017-05-03 NOTE — Discharge Summary (Signed)
Physician Discharge Summary  Patient ID: Kimberly Mcbride MRN: [id number] DOB/AGE: [birth year] 36 y.o.  Admit date: 05/02/2017 Discharge date: 05/03/2017  Admission Diagnoses:CPP , menorrhagia   Discharge Diagnoses: same  Active Problems:   Post-operative state   Discharged Condition: good  Hospital Course: uncomplicated LAVH and bilateral salpingectomy   Consults: None  Significant Diagnostic Studies: labs:  Results for orders placed or performed during the hospital encounter of 05/02/17 (from the past 24 hour(s))  CBC     Status: Abnormal   Collection Time: 05/03/17  7:33 AM  Result Value Ref Range   WBC 16.1 (H) 3.6 - 11.0 K/uL   RBC 4.42 3.80 - 5.20 MIL/uL   Hemoglobin 13.4 12.0 - 16.0 g/dL   HCT 38.5 35.0 - 47.0 %   MCV 87.2 80.0 - 100.0 fL   MCH 30.3 26.0 - 34.0 pg   MCHC 34.8 32.0 - 36.0 g/dL   RDW 13.0 11.5 - 14.5 %   Platelets 353 150 - 440 K/uL  Basic metabolic panel     Status: Abnormal   Collection Time: 05/03/17  7:33 AM  Result Value Ref Range   Sodium 138 135 - 145 mmol/L   Potassium 3.6 3.5 - 5.1 mmol/L   Chloride 106 101 - 111 mmol/L   CO2 26 22 - 32 mmol/L   Glucose, Bld 113 (H) 65 - 99 mg/dL   BUN 9 6 - 20 mg/dL   Creatinine, Ser 0.54 0.44 - 1.00 mg/dL   Calcium 9.0 8.9 - 10.3 mg/dL   GFR calc non Af Amer >60 >60 mL/min   GFR calc Af Amer >60 >60 mL/min   Anion gap 6 5 - 15     Treatments: surgery: as above  Discharge Exam: Blood pressure 102/61, pulse 64, temperature 98 F (36.7 C), temperature source Oral, resp. rate 18, height 5\' 3"  (1.6 m), weight 70.3 kg (155 lb), SpO2 100 %. General appearance: alert and cooperative Resp: clear to auscultation bilaterally Cardio: regular rate and rhythm, S1, S2 normal, no murmur, click, rub or gallop GI: + Bs appropriateTTP   Disposition: 01-Home or Self Care  Discharge Instructions    Call MD for:  difficulty breathing, headache or visual disturbances    Complete by:  As directed    Call MD for:   extreme fatigue    Complete by:  As directed    Call MD for:  hives    Complete by:  As directed    Call MD for:  persistant dizziness or light-headedness    Complete by:  As directed    Call MD for:  persistant nausea and vomiting    Complete by:  As directed    Call MD for:  redness, tenderness, or signs of infection (pain, swelling, redness, odor or green/yellow discharge around incision site)    Complete by:  As directed    Call MD for:  severe uncontrolled pain    Complete by:  As directed    Call MD for:  temperature >100.4    Complete by:  As directed    Diet - low sodium heart healthy    Complete by:  As directed    Increase activity slowly    Complete by:  As directed      Allergies as of 05/03/2017      Reactions   Codeine Rash      Medication List    TAKE these medications   docusate sodium 100 MG capsule Commonly known as:  COLACE Take 1 capsule (100 mg total) by mouth daily as needed.   hydrochlorothiazide 25 MG tablet Commonly known as:  HYDRODIURIL Take 12.5-25 mg by mouth 2 (two) times daily. 12.5 mg in the evening & 25 mg in the morning.   ibuprofen 800 MG tablet Commonly known as:  ADVIL,MOTRIN Take 1 tablet (800 mg total) by mouth every 8 (eight) hours as needed. What changed:  medication strength  how much to take  reasons to take this   ondansetron 4 MG tablet Commonly known as:  ZOFRAN Take 1 tablet (4 mg total) by mouth every 6 (six) hours as needed for nausea.   oxyCODONE-acetaminophen 5-325 MG tablet Commonly known as:  PERCOCET/ROXICET Take 1-2 tablets by mouth every 4 (four) hours as needed for moderate pain (moderate to severe pain (when tolerating fluids)).   REWETTING DROPS Soln Place 1-2 drops into both eyes daily as needed (for dry eyesl).   SYNTHROID 112 MCG tablet Generic drug:  levothyroxine Take 112 mcg by mouth daily before breakfast.      Follow-up Information    Alynah Schone, Gwen Her, MD Follow up in 2 week(s).    Specialty:  Obstetrics and Gynecology Why:  post op  Contact information: [address] [place] [address] [phone]           Signed: Gwen Her Dervin Vore 05/03/2017, 9:08 AM

## 2017-06-29 ENCOUNTER — Encounter: Payer: Self-pay | Admitting: Gastroenterology

## 2019-01-04 DIAGNOSIS — H8309 Labyrinthitis, unspecified ear: Secondary | ICD-10-CM | POA: Diagnosis not present

## 2019-01-18 DIAGNOSIS — N760 Acute vaginitis: Secondary | ICD-10-CM | POA: Diagnosis not present

## 2019-01-18 DIAGNOSIS — E063 Autoimmune thyroiditis: Secondary | ICD-10-CM | POA: Diagnosis not present

## 2019-01-18 DIAGNOSIS — Z01419 Encounter for gynecological examination (general) (routine) without abnormal findings: Secondary | ICD-10-CM | POA: Diagnosis not present

## 2019-01-23 DIAGNOSIS — Z01419 Encounter for gynecological examination (general) (routine) without abnormal findings: Secondary | ICD-10-CM | POA: Diagnosis not present

## 2020-10-22 ENCOUNTER — Encounter: Payer: Self-pay | Admitting: Emergency Medicine

## 2020-10-22 ENCOUNTER — Ambulatory Visit
Admission: EM | Admit: 2020-10-22 | Discharge: 2020-10-22 | Disposition: A | Discharge status: Home / Self Care | Payer: 59 | Attending: Family Medicine | Admitting: Family Medicine

## 2020-10-22 DIAGNOSIS — J029 Acute pharyngitis, unspecified: Secondary | ICD-10-CM | POA: Diagnosis present

## 2020-10-22 LAB — POCT RAPID STREP A (OFFICE): Rapid Strep A Screen: NEGATIVE

## 2020-10-22 NOTE — ED Provider Notes (Signed)
Kimberly Mcbride    CSN: [id number] Arrival date & time: 10/22/20  1210      History   Chief Complaint Chief Complaint  Patient presents with  . Sore Throat    HPI Kimberly Mcbride is a 39 y.o. female.   Patient is a 39 year old female presents today with approximately 1 day of sore throat, neck sinus, ear pain and pressure.  Symptoms have been constant.  No fevers noted at home, low-grade fever here today.  Son has been sick with allergy symptoms.  Taking Ibuprofen for headache which seems to help.  No cough, chest pain or shortness of breath.     Past Medical History:  Diagnosis Date  . Anxiety   . Asthma   . Depression   . Dysrhythmia    DUE TO THYROID  . Headache    MIGRAINS/ H/O  . Hypothyroidism     Patient Active Problem List   Diagnosis Date Noted  . Post-operative state 05/02/2017    Past Surgical History:  Procedure Laterality Date  . DIAGNOSTIC LAPAROSCOPY    . LAPAROSCOPIC VAGINAL HYSTERECTOMY WITH SALPINGECTOMY Bilateral 05/02/2017   Procedure: LAPAROSCOPIC ASSISTED VAGINAL HYSTERECTOMY WITH SALPINGECTOMY;  Surgeon: Schermerhorn, Gwen Her, MD;  Location: [facility] ORS;  Service: Gynecology;  Laterality: Bilateral;  . TONSILLECTOMY      OB History   No obstetric history on file.      Home Medications    Prior to Admission medications   Medication Sig Start Date End Date Taking? Authorizing Provider  hydrochlorothiazide (HYDRODIURIL) 25 MG tablet Take 12.5-25 mg by mouth 2 (two) times daily. 12.5 mg in the evening & 25 mg in the morning. 12/27/16   [provider]  ibuprofen (ADVIL,MOTRIN) 800 MG tablet Take 1 tablet (800 mg total) by mouth every 8 (eight) hours as needed. 05/03/17   Schermerhorn, Gwen Her, MD  omeprazole (PRILOSEC) 20 MG capsule Take 20 mg by mouth daily. 07/18/20   [provider]  ondansetron (ZOFRAN) 4 MG tablet Take 1 tablet (4 mg total) by mouth every 6 (six) hours as needed for nausea. 05/03/17   Schermerhorn,  Gwen Her, MD  oxyCODONE-acetaminophen (PERCOCET/ROXICET) 5-325 MG tablet Take 1-2 tablets by mouth every 4 (four) hours as needed for moderate pain (moderate to severe pain (when tolerating fluids)). 05/03/17   Schermerhorn, Gwen Her, MD  Soft Lens Products (REWETTING DROPS) SOLN Place 1-2 drops into both eyes daily as needed (for dry eyesl).    [provider]  SYNTHROID 112 MCG tablet Take 112 mcg by mouth daily before breakfast.  12/26/16   [provider]    Family History History reviewed. No pertinent family history.  Social History Social History   Tobacco Use  . Smoking status: Never Smoker  . Smokeless tobacco: Never Used  Substance Use Topics  . Alcohol use: No  . Drug use: No     Allergies   Codeine   Review of Systems Review of Systems   Physical Exam Triage Vital Signs ED Triage Vitals  Enc Vitals Group     BP 10/22/20 1238 116/81     Pulse Rate 10/22/20 1238 70     Resp 10/22/20 1238 16     Temp 10/22/20 1238 99.1 F (37.3 C)     Temp Source 10/22/20 1238 Oral     SpO2 10/22/20 1238 98 %     Weight 10/22/20 1239 165 lb (74.8 kg)     Height --      Head  Circumference --      Peak Flow --      Pain Score 10/22/20 1239 0     Pain Loc --      Pain Edu? --      Excl. in [place]? --    No data found.  Updated Vital Signs BP 116/81 (BP Location: Left Arm)   Pulse 70   Temp 99.1 F (37.3 C) (Oral)   Resp 16   Wt 165 lb (74.8 kg)   LMP 03/28/2017 (Approximate)   SpO2 98%   BMI 29.23 kg/m   Visual Acuity Right Eye Distance:   Left Eye Distance:   Bilateral Distance:    Right Eye Near:   Left Eye Near:    Bilateral Near:     Physical Exam Vitals and nursing note reviewed.  Constitutional:      General: She is not in acute distress.    Appearance: Normal appearance. She is not ill-appearing, toxic-appearing or diaphoretic.  HENT:     Head: Normocephalic.     Right Ear: Tympanic membrane and ear canal normal.     Left Ear:  Tympanic membrane and ear canal normal.     Nose: Nose normal.     Mouth/Throat:     Pharynx: Oropharynx is clear. Posterior oropharyngeal erythema present.  Eyes:     Conjunctiva/sclera: Conjunctivae normal.  Cardiovascular:     Rate and Rhythm: Normal rate and regular rhythm.  Pulmonary:     Effort: Pulmonary effort is normal.     Breath sounds: Normal breath sounds.  Musculoskeletal:        General: Normal range of motion.     Cervical back: Normal range of motion.  Skin:    General: Skin is warm and dry.     Findings: No rash.  Neurological:     Mental Status: She is alert.  Psychiatric:        Mood and Affect: Mood normal.      UC Treatments / Results  Labs (all labs ordered are listed, but only abnormal results are displayed) Labs Reviewed  CULTURE, GROUP A STREP [facility])  POCT RAPID STREP A (OFFICE)    EKG   Radiology No results found.  Procedures Procedures (including critical care time)  Medications Ordered in UC Medications - No data to display  Initial Impression / Assessment and Plan / UC Course  I have reviewed the triage vital signs and the nursing notes.  Pertinent labs & imaging results that were available during my care of the patient were reviewed by me and considered in my medical decision making (see chart for details).     Sore throat Most likely viral or allergy related.  Strep test negative here today.  Sending for culture. Recommended Flonase and Zyrtec for symptoms.  Ibuprofen as needed Follow up as needed for continued or worsening symptoms  Final Clinical Impressions(s) / UC Diagnoses   Final diagnoses:  Sore throat     Discharge Instructions     Your strep test is negative.  We will send for culture to verify. You can continue the ibuprofen as needed for sore throat.  Recommend Flonase and Zyrtec in case this is allergy related.  This will help with your ear pressure also. Please follow-up here with your doctor for any  continued or worsening issues    ED Prescriptions    None     PDMP not reviewed this encounter.   Loura Halt A, NP 10/22/20 1320

## 2020-10-22 NOTE — Discharge Instructions (Addendum)
Your strep test is negative.  We will send for culture to verify. You can continue the ibuprofen as needed for sore throat.  Recommend Flonase and Zyrtec in case this is allergy related.  This will help with your ear pressure also. Please follow-up here with your doctor for any continued or worsening issues

## 2020-10-22 NOTE — ED Triage Notes (Signed)
Pt has been having sore throat, neck soreness, and ear pain since yesterday.  No fevers to note.

## 2020-10-25 LAB — CULTURE, GROUP A STREP (THRC)

## 2020-11-21 ENCOUNTER — Other Ambulatory Visit: Payer: Self-pay

## 2020-11-21 ENCOUNTER — Ambulatory Visit
Admission: EM | Admit: 2020-11-21 | Discharge: 2020-11-21 | Disposition: A | Discharge status: Home / Self Care | Payer: 59 | Attending: Family Medicine | Admitting: Family Medicine

## 2020-11-21 DIAGNOSIS — Z1152 Encounter for screening for COVID-19: Secondary | ICD-10-CM

## 2020-11-21 DIAGNOSIS — Z20822 Contact with and (suspected) exposure to covid-19: Secondary | ICD-10-CM | POA: Diagnosis not present

## 2020-11-21 NOTE — Discharge Instructions (Addendum)
Covid test pending You can take over the counter medicines as needed Follow up as needed for continued or worsening symptoms

## 2020-11-21 NOTE — ED Triage Notes (Signed)
Pt reports having sinus pressure, sore throat, and was exposed to someone who tested positive for covid. Symptoms started 12/5.

## 2020-11-21 NOTE — ED Provider Notes (Signed)
Kimberly Mcbride    CSN: [id number] Arrival date & time: 11/21/20  1121      History   Chief Complaint Chief Complaint  Patient presents with  . Sore Throat  . Facial Pain    HPI Kimberly Mcbride is a 40 y.o. female.   Patient is a 40 year old female who presents today with sinus pressure, sore throat, low-grade fever.  This started approximately 2 days ago.  Had positive exposure to Covid.  No cough, chest congestion     Past Medical History:  Diagnosis Date  . Anxiety   . Asthma   . Depression   . Dysrhythmia    DUE TO THYROID  . Headache    MIGRAINS/ H/O  . Hypothyroidism     Patient Active Problem List   Diagnosis Date Noted  . Post-operative state 05/02/2017    Past Surgical History:  Procedure Laterality Date  . DIAGNOSTIC LAPAROSCOPY    . LAPAROSCOPIC VAGINAL HYSTERECTOMY WITH SALPINGECTOMY Bilateral 05/02/2017   Procedure: LAPAROSCOPIC ASSISTED VAGINAL HYSTERECTOMY WITH SALPINGECTOMY;  Surgeon: Schermerhorn, Gwen Her, MD;  Location: [facility] ORS;  Service: Gynecology;  Laterality: Bilateral;  . TONSILLECTOMY      OB History   No obstetric history on file.      Home Medications    Prior to Admission medications   Medication Sig Start Date End Date Taking? Authorizing Provider  hydrochlorothiazide (HYDRODIURIL) 25 MG tablet Take 12.5-25 mg by mouth 2 (two) times daily. 12.5 mg in the evening & 25 mg in the morning. 12/27/16   [provider]  ibuprofen (ADVIL,MOTRIN) 800 MG tablet Take 1 tablet (800 mg total) by mouth every 8 (eight) hours as needed. 05/03/17   Schermerhorn, Gwen Her, MD  omeprazole (PRILOSEC) 20 MG capsule Take 20 mg by mouth daily. 07/18/20   [provider]  ondansetron (ZOFRAN) 4 MG tablet Take 1 tablet (4 mg total) by mouth every 6 (six) hours as needed for nausea. 05/03/17   Schermerhorn, Gwen Her, MD  oxyCODONE-acetaminophen (PERCOCET/ROXICET) 5-325 MG tablet Take 1-2 tablets by mouth every 4 (four) hours as needed  for moderate pain (moderate to severe pain (when tolerating fluids)). 05/03/17   Schermerhorn, Gwen Her, MD  Soft Lens Products (REWETTING DROPS) SOLN Place 1-2 drops into both eyes daily as needed (for dry eyesl).    [provider]  SYNTHROID 112 MCG tablet Take 112 mcg by mouth daily before breakfast.  12/26/16   [provider]    Family History History reviewed. No pertinent family history.  Social History Social History   Tobacco Use  . Smoking status: Never Smoker  . Smokeless tobacco: Never Used  Substance Use Topics  . Alcohol use: No  . Drug use: No     Allergies   Codeine   Review of Systems Review of Systems   Physical Exam Triage Vital Signs ED Triage Vitals  Enc Vitals Group     BP 11/21/20 1139 109/74     Pulse Rate 11/21/20 1139 77     Resp 11/21/20 1139 16     Temp 11/21/20 1139 99.5 F (37.5 C)     Temp Source 11/21/20 1139 Oral     SpO2 11/21/20 1139 97 %     Weight 11/21/20 1151 170 lb (77.1 kg)     Height --      Head Circumference --      Peak Flow --      Pain Score --      Pain  Loc --      Pain Edu? --      Excl. in [place]? --    No data found.  Updated Vital Signs BP 109/74 (BP Location: Left Arm)   Pulse 77   Temp 99.5 F (37.5 C) (Oral)   Resp 16   Wt 170 lb (77.1 kg)   LMP 03/28/2017 (Approximate)   SpO2 97%   BMI 30.11 kg/m   Visual Acuity Right Eye Distance:   Left Eye Distance:   Bilateral Distance:    Right Eye Near:   Left Eye Near:    Bilateral Near:     Physical Exam Vitals and nursing note reviewed.  Constitutional:      General: She is not in acute distress.    Appearance: Normal appearance. She is not ill-appearing, toxic-appearing or diaphoretic.  HENT:     Head: Normocephalic.  Eyes:     Conjunctiva/sclera: Conjunctivae normal.  Pulmonary:     Effort: Pulmonary effort is normal.  Musculoskeletal:        General: Normal range of motion.     Cervical back: Normal range of motion.   Skin:    General: Skin is warm and dry.     Findings: No rash.  Neurological:     Mental Status: She is alert.  Psychiatric:        Mood and Affect: Mood normal.      UC Treatments / Results  Labs (all labs ordered are listed, but only abnormal results are displayed) Labs Reviewed  COVID-19, FLU A+B NAA    EKG   Radiology No results found.  Procedures Procedures (including critical care time)  Medications Ordered in UC Medications - No data to display  Initial Impression / Assessment and Plan / UC Course  I have reviewed the triage vital signs and the nursing notes.  Pertinent labs & imaging results that were available during my care of the patient were reviewed by me and considered in my medical decision making (see chart for details).     Covid exposure Covid test pending.  Recommend over-the-counter medicines as needed for symptoms. Follow up as needed for continued or worsening symptoms  Final Clinical Impressions(s) / UC Diagnoses   Final diagnoses:  Close exposure to COVID-19 virus     Discharge Instructions     Covid test pending You can take over the counter medicines as needed Follow up as needed for continued or worsening symptoms     ED Prescriptions    None     PDMP not reviewed this encounter.   Orvan July, NP 11/21/20 1159

## 2020-11-25 LAB — COVID-19, FLU A+B NAA
Influenza A, NAA: NOT DETECTED
Influenza B, NAA: NOT DETECTED
SARS-CoV-2, NAA: DETECTED — AB

## 2021-03-24 ENCOUNTER — Other Ambulatory Visit: Payer: Self-pay | Admitting: Obstetrics and Gynecology

## 2021-03-24 DIAGNOSIS — Z1231 Encounter for screening mammogram for malignant neoplasm of breast: Secondary | ICD-10-CM

## 2021-09-28 ENCOUNTER — Ambulatory Visit
Admission: RE | Admit: 2021-09-28 | Discharge: 2021-09-28 | Disposition: A | Discharge status: Home / Self Care | Payer: 59 | Source: Ambulatory Visit | Attending: Obstetrics and Gynecology | Admitting: Obstetrics and Gynecology

## 2021-09-28 ENCOUNTER — Other Ambulatory Visit: Payer: Self-pay

## 2021-09-28 DIAGNOSIS — Z1231 Encounter for screening mammogram for malignant neoplasm of breast: Secondary | ICD-10-CM | POA: Insufficient documentation

## 2021-10-03 ENCOUNTER — Encounter: Payer: Self-pay | Admitting: Emergency Medicine

## 2021-10-03 ENCOUNTER — Ambulatory Visit
Admission: EM | Admit: 2021-10-03 | Discharge: 2021-10-03 | Disposition: A | Discharge status: Home / Self Care | Payer: 59 | Attending: Emergency Medicine | Admitting: Emergency Medicine

## 2021-10-03 ENCOUNTER — Other Ambulatory Visit: Payer: Self-pay

## 2021-10-03 DIAGNOSIS — R21 Rash and other nonspecific skin eruption: Secondary | ICD-10-CM | POA: Insufficient documentation

## 2021-10-03 MED ORDER — VALACYCLOVIR HCL 1 G PO TABS: 1000.0000 mg | ORAL_TABLET | Freq: Three times a day (TID) | ORAL | 0 refills | Status: DC

## 2021-10-03 NOTE — ED Triage Notes (Signed)
Pt here with rash in right groin area x 4 days that is very painful and has soreness in lymph nodes in that area.

## 2021-10-03 NOTE — Discharge Instructions (Addendum)
Take the Valtrex as directed.  Your test results are pending and we will call you if positive.  Follow up with your primary care provider if your symptoms are not improving.

## 2021-10-03 NOTE — ED Provider Notes (Signed)
[name]    CSN: [id number] Arrival date & time: 10/03/21  1254      History   Chief Complaint Chief Complaint  Patient presents with   Rash    HPI Kimberly Mcbride is a 40 y.o. female.  Patient presents with painful rash on her right groin and mons pubis x4 days.  Her right groin has a painful swollen lymph node also.  Patient is concerned for shingles.  She denies fever, chills, abdominal pain, dysuria, vaginal discharge, pelvic pain, or other symptoms.  Treatment attempted at home with hydrocortisone cream.  Patient reports no history of HSV.  She is married in a monogamous relationship and has no suspicion of STDs.  Her medical history includes asthma, hypothyroidism, depression, anxiety, headaches.  The history is provided by the patient and medical records.   Past Medical History:  Diagnosis Date   Anxiety    Asthma    Depression    Dysrhythmia    DUE TO THYROID   Headache    MIGRAINS/ H/O   Hypothyroidism     Patient Active Problem List   Diagnosis Date Noted   Post-operative state 05/02/2017    Past Surgical History:  Procedure Laterality Date   ABDOMINAL HYSTERECTOMY     DIAGNOSTIC LAPAROSCOPY     LAPAROSCOPIC VAGINAL HYSTERECTOMY WITH SALPINGECTOMY Bilateral 05/02/2017   Procedure: LAPAROSCOPIC ASSISTED VAGINAL HYSTERECTOMY WITH SALPINGECTOMY;  Surgeon: Boykin Nearing, MD;  Location: [facility] ORS;  Service: Gynecology;  Laterality: Bilateral;   TONSILLECTOMY      OB History   No obstetric history on file.      Home Medications    Prior to Admission medications   Medication Sig Start Date End Date Taking? Authorizing Provider  valACYclovir (VALTREX) 1000 MG tablet Take 1 tablet (1,000 mg total) by mouth 3 (three) times daily. 10/03/21  Yes Sharion Balloon, NP  hydrochlorothiazide (HYDRODIURIL) 25 MG tablet Take 12.5-25 mg by mouth 2 (two) times daily. 12.5 mg in the evening & 25 mg in the morning. 12/27/16   [provider]   ibuprofen (ADVIL,MOTRIN) 800 MG tablet Take 1 tablet (800 mg total) by mouth every 8 (eight) hours as needed. 05/03/17   Schermerhorn, Gwen Her, MD  omeprazole (PRILOSEC) 20 MG capsule Take 20 mg by mouth daily. 07/18/20   [provider]  ondansetron (ZOFRAN) 4 MG tablet Take 1 tablet (4 mg total) by mouth every 6 (six) hours as needed for nausea. 05/03/17   Schermerhorn, Gwen Her, MD  oxyCODONE-acetaminophen (PERCOCET/ROXICET) 5-325 MG tablet Take 1-2 tablets by mouth every 4 (four) hours as needed for moderate pain (moderate to severe pain (when tolerating fluids)). 05/03/17   Schermerhorn, Gwen Her, MD  Soft Lens Products (REWETTING DROPS) SOLN Place 1-2 drops into both eyes daily as needed (for dry eyesl).    [provider]  SYNTHROID 112 MCG tablet Take 112 mcg by mouth daily before breakfast.  12/26/16   [provider]    Family History History reviewed. No pertinent family history.  Social History Social History   Tobacco Use   Smoking status: Never   Smokeless tobacco: Never  Substance Use Topics   Alcohol use: No   Drug use: No     Allergies   Codeine   Review of Systems Review of Systems  Constitutional:  Negative for chills and fever.  Respiratory:  Negative for cough and shortness of breath.   Gastrointestinal:  Negative for abdominal pain and vomiting.  Genitourinary:  Negative for dysuria, flank pain, hematuria, pelvic pain and vaginal discharge.  Skin:  Positive for color change and rash.  All other systems reviewed and are negative.   Physical Exam Triage Vital Signs ED Triage Vitals  Enc Vitals Group     BP      Pulse      Resp      Temp      Temp src      SpO2      Weight      Height      Head Circumference      Peak Flow      Pain Score      Pain Loc      Pain Edu?      Excl. in [place]?    No data found.  Updated Vital Signs BP 135/88 (BP Location: Left Arm)   Pulse 87   Temp 98.7 F (37.1 C) (Oral)   Resp 16    LMP 03/28/2017 (Approximate)   SpO2 97%   Visual Acuity Right Eye Distance:   Left Eye Distance:   Bilateral Distance:    Right Eye Near:   Left Eye Near:    Bilateral Near:     Physical Exam Vitals and nursing note reviewed.  Constitutional:      General: She is not in acute distress.    Appearance: She is well-developed.  HENT:     Head: Normocephalic and atraumatic.     Mouth/Throat:     Mouth: Mucous membranes are moist.  Cardiovascular:     Rate and Rhythm: Normal rate and regular rhythm.     Heart sounds: Normal heart sounds.  Pulmonary:     Effort: Pulmonary effort is normal. No respiratory distress.     Breath sounds: Normal breath sounds.  Abdominal:     Palpations: Abdomen is soft.     Tenderness: There is no abdominal tenderness.  Genitourinary:    Comments: Erythematous discrete papular rash on right mons pubis.  No drainage.  Additionally one quarter-sized cluster of papules on right upper thigh at the groin.  No drainage.  Tender enlarged inguinal lymph node. Musculoskeletal:     Cervical back: Neck supple.  Skin:    General: Skin is warm and dry.  Neurological:     Mental Status: She is alert.     Gait: Gait normal.  Psychiatric:        Mood and Affect: Mood normal.        Behavior: Behavior normal.     UC Treatments / Results  Labs (all labs ordered are listed, but only abnormal results are displayed) Labs Reviewed  HSV CULTURE AND TYPING    EKG   Radiology No results found.  Procedures Procedures (including critical care time)  Medications Ordered in UC Medications - No data to display  Initial Impression / Assessment and Plan / UC Course  I have reviewed the triage vital signs and the nursing notes.  Pertinent labs & imaging results that were available during my care of the patient were reviewed by me and considered in my medical decision making (see chart for details).   Rash on mons pubis and right groin/upper thigh.  The rash  appears to be more of a folliculitis but patient is concerned for shingles.  Swab obtained for HSV testing.  Treating with Valtrex.  Instructed patient to abstain from sexual activity until her test result is back.  Instructed her to stop using hydrocortisone cream and instead  use topical antibiotic ointment.  Instructed her to follow-up with her PCP or gynecologist in 1 week for a recheck.  She agrees to plan of care.   Final Clinical Impressions(s) / UC Diagnoses   Final diagnoses:  Rash     Discharge Instructions      Take the Valtrex as directed.  Your test results are pending and we will call you if positive.  Follow up with your primary care provider if your symptoms are not improving.        ED Prescriptions     Medication Sig Dispense Auth. Provider   valACYclovir (VALTREX) 1000 MG tablet Take 1 tablet (1,000 mg total) by mouth 3 (three) times daily. 21 tablet Sharion Balloon, NP      PDMP not reviewed this encounter.   Sharion Balloon, NP 10/03/21 [phone]

## 2021-10-07 LAB — HSV CULTURE AND TYPING

## 2022-02-25 ENCOUNTER — Other Ambulatory Visit: Payer: Self-pay | Admitting: Obstetrics and Gynecology

## 2022-02-25 DIAGNOSIS — N6459 Other signs and symptoms in breast: Secondary | ICD-10-CM

## 2022-03-12 ENCOUNTER — Ambulatory Visit
Admission: RE | Admit: 2022-03-12 | Discharge: 2022-03-12 | Disposition: A | Discharge status: Home / Self Care | Payer: 59 | Source: Ambulatory Visit | Attending: Obstetrics and Gynecology | Admitting: Obstetrics and Gynecology

## 2022-03-12 DIAGNOSIS — N6459 Other signs and symptoms in breast: Secondary | ICD-10-CM

## 2022-08-01 IMAGING — MG MM DIGITAL DIAGNOSTIC UNILAT*R* W/ TOMO W/ CAD
8 series · 8 of 24 positions shown · non-contrast
Comparison: Previous exam(s).

CLINICAL DATA: 40-year-old presenting with possible palpable
thickening associated with focal pain and tenderness in the lower
RIGHT breast.

EXAM:
DIGITAL DIAGNOSTIC UNILATERAL RIGHT MAMMOGRAM WITH TOMOSYNTHESIS AND
CAD; ULTRASOUND RIGHT BREAST LIMITED
TECHNIQUE: Right digital diagnostic mammography and breast tomosynthesis was
performed. The images were evaluated with computer-aided detection.;
Targeted ultrasound examination of the right breast was performed.

[R ML synth-2D]
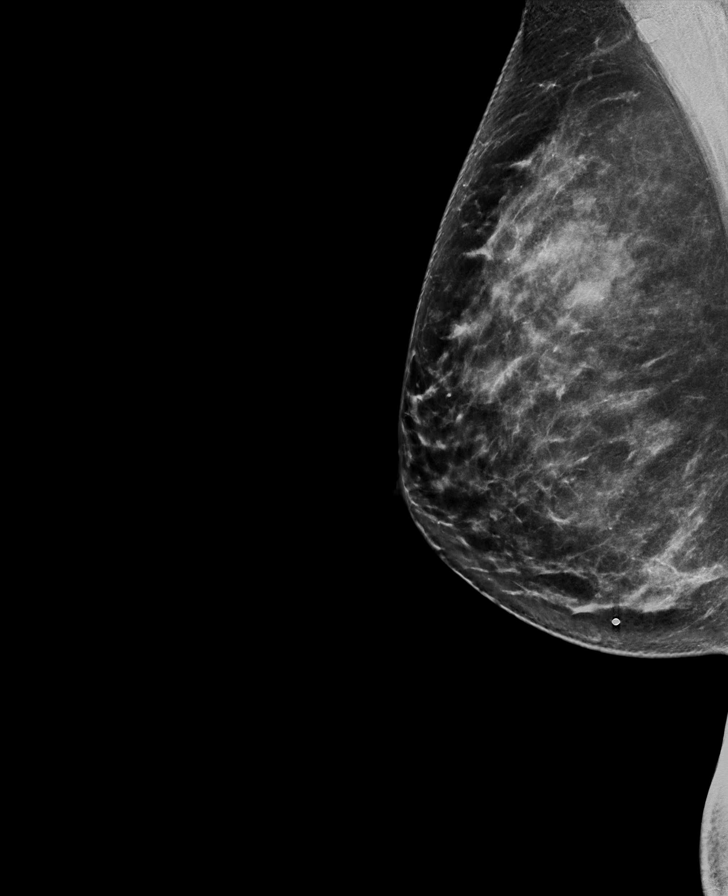

[R TAN synth-2D]
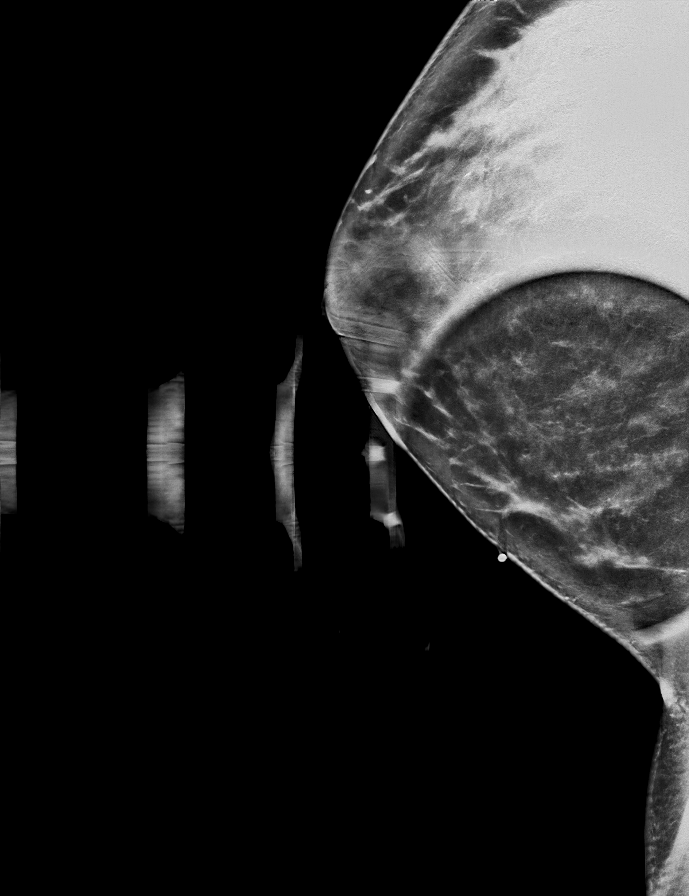

[R CC synth-2D]
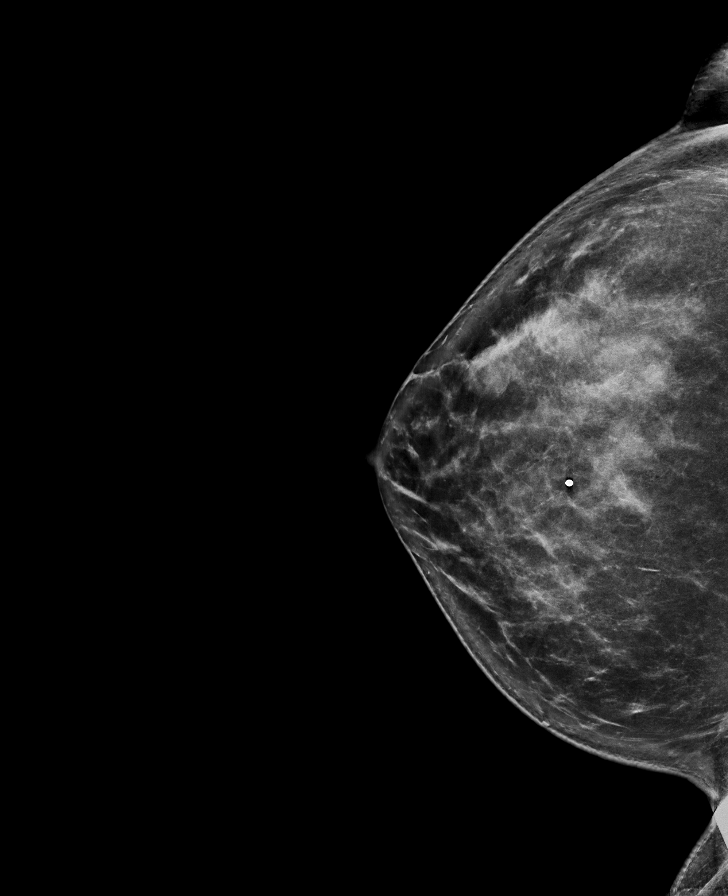

[R MLO synth-2D]
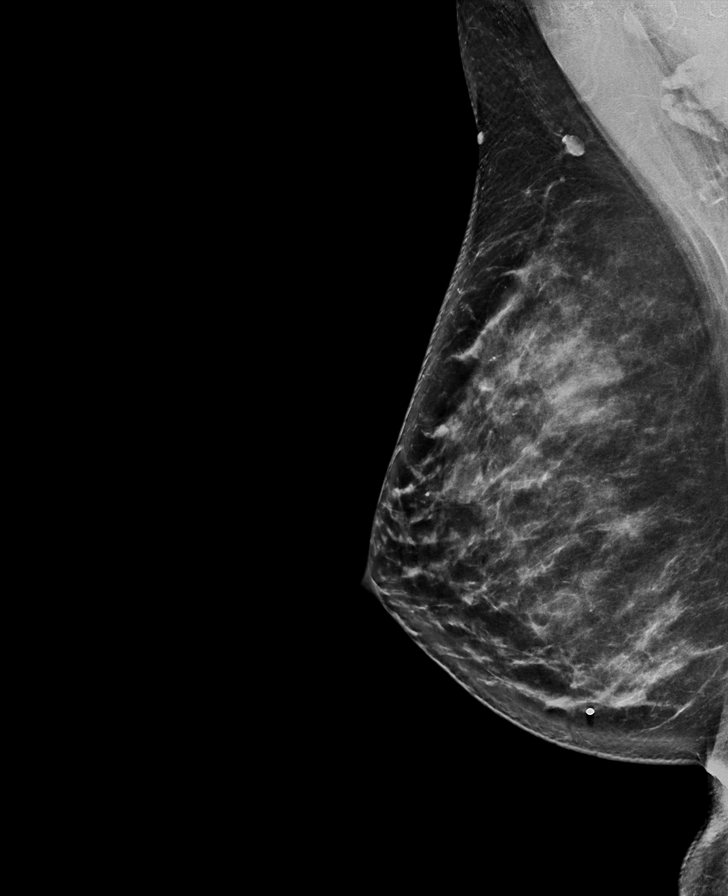

[R TAN tomo · tomo slice 43/85.0]
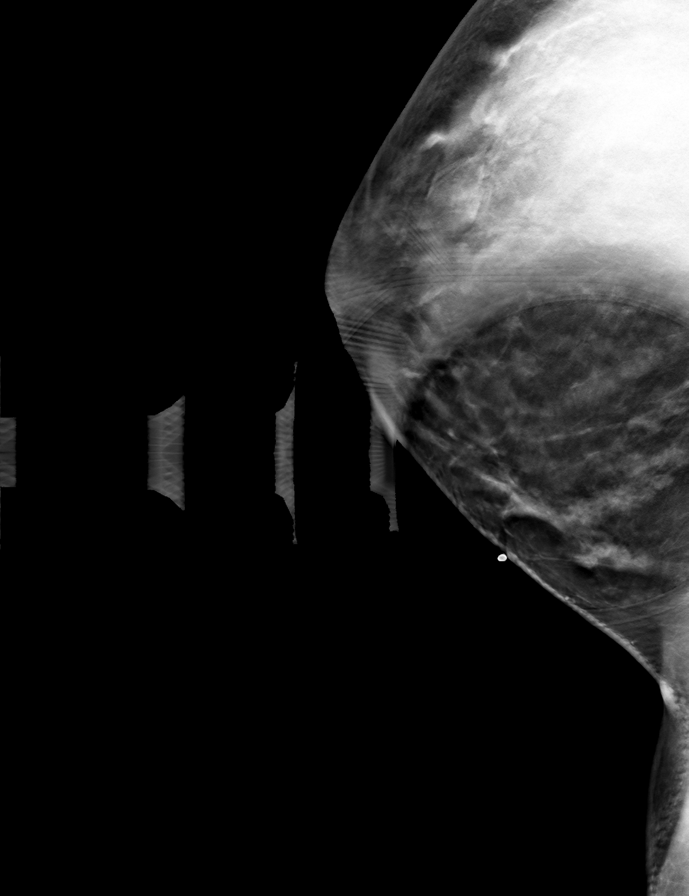

[R ML tomo · tomo slice 50/99.0]
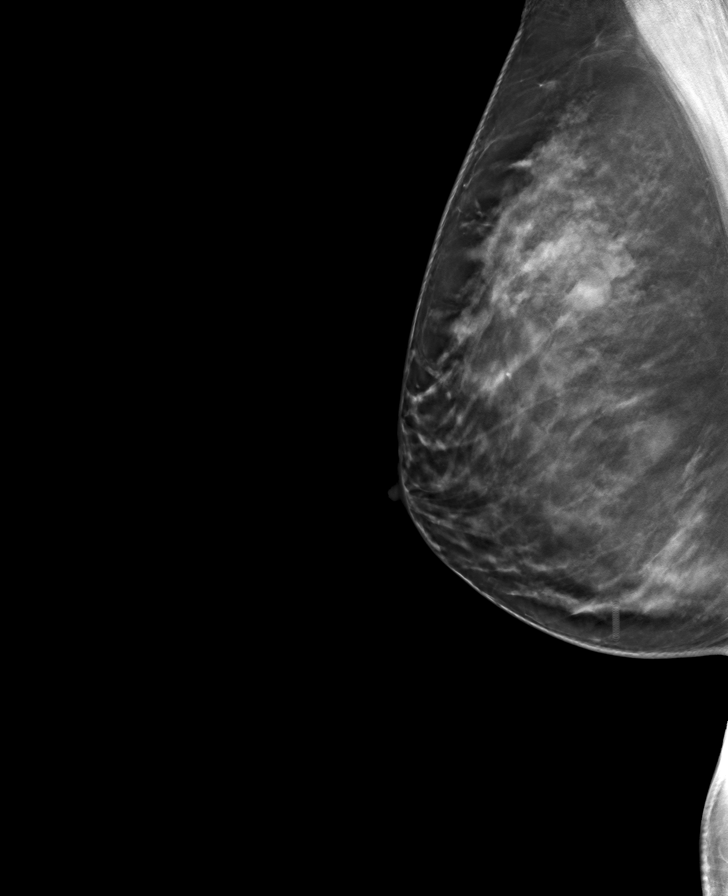

[R CC tomo · tomo slice 49/97.0]
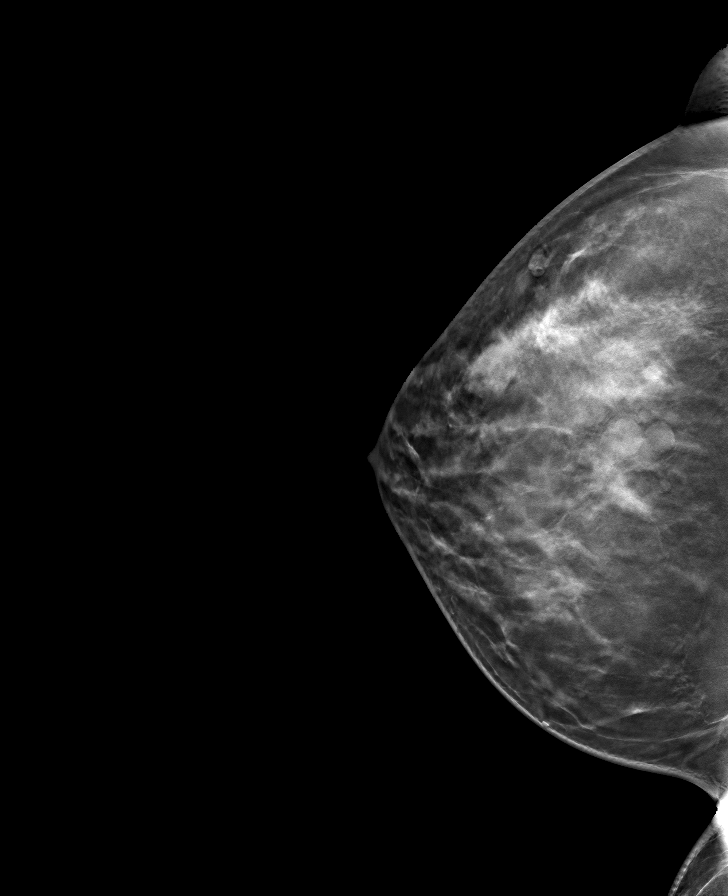

[R MLO tomo · tomo slice 51/100.0]
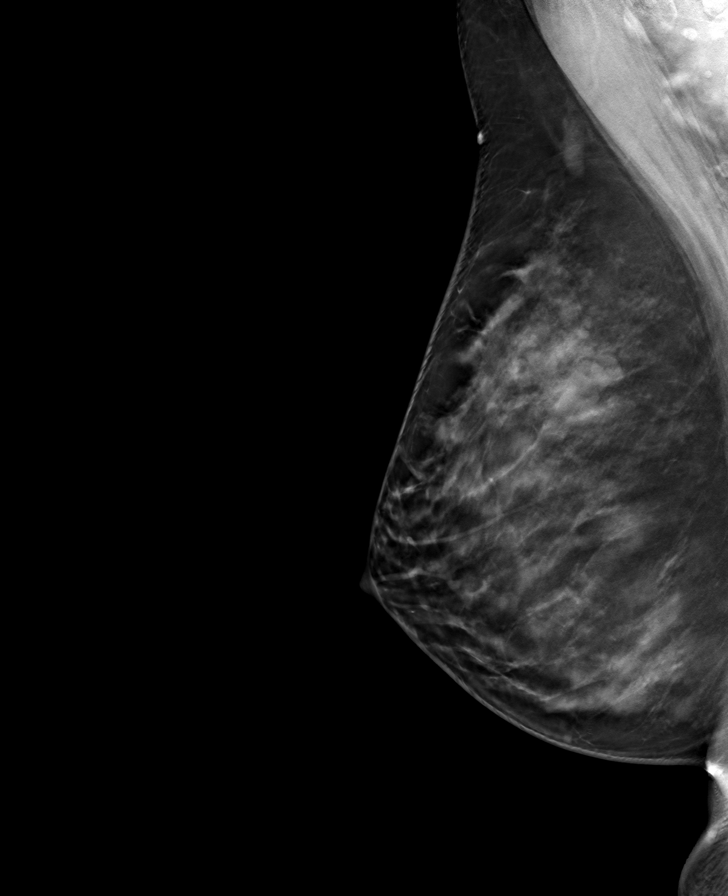

[8 of 24 positions shown; findings below may reference images not displayed]

ACR Breast Density Category c: The breast tissue is heterogeneously
dense, which may obscure small masses.
FINDINGS: Full field CC and MLO views and a spot tangential view of the
palpable concern were obtained. The palpable concern was marked with
a metallic BB.

No mammographic abnormality in the area of palpable concern in the
lower breast. Normal fibroglandular tissue extending very close to
the skin surface is present in this location.

Partially obscured isodense masses in the upper breast, unchanged
from prior mammograms. No new or suspicious findings.

Targeted ultrasound is performed in the area of concern,
demonstrating normal fibroglandular tissue at the 6:30 o'clock
position 8 cm from the nipple. No cyst, solid mass or abnormal
acoustic shadowing is identified.

On correlative physical examination, the site of palpable thickening
and focal pain and tenderness corresponds to the costal cartilage of
an anterior RIGHT rib. There is no palpable abnormality in the lower
breast.
IMPRESSION: No mammographic or sonographic evidence of malignancy involving the
RIGHT breast.

RECOMMENDATION:
Annual BILATERAL screening mammography which is due in September 2022.

I have discussed the findings and recommendations with the patient.
If applicable, a reminder letter will be sent to the patient
regarding the next appointment.

BI-RADS CATEGORY  2: Benign.

## 2022-08-01 IMAGING — US US BREAST*R* LIMITED INC AXILLA
1 series · 2 of 2 positions shown · non-contrast
Comparison: Previous exam(s).

CLINICAL DATA: 40-year-old presenting with possible palpable
thickening associated with focal pain and tenderness in the lower
RIGHT breast.

EXAM:
DIGITAL DIAGNOSTIC UNILATERAL RIGHT MAMMOGRAM WITH TOMOSYNTHESIS AND
CAD; ULTRASOUND RIGHT BREAST LIMITED
TECHNIQUE: Right digital diagnostic mammography and breast tomosynthesis was
performed. The images were evaluated with computer-aided detection.;
Targeted ultrasound examination of the right breast was performed.

[Series 1: us breast*right* limited inc axilla · 0.07mm/px · 2 of 2 slices shown]
[im 1/2]
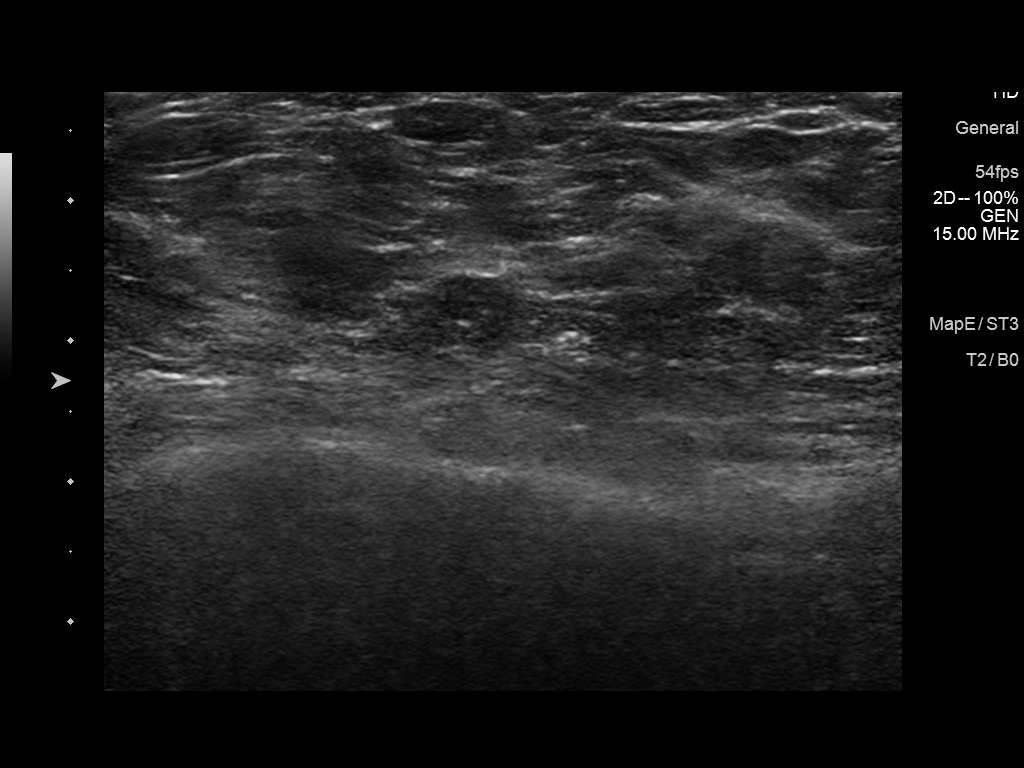
[im 2/2]
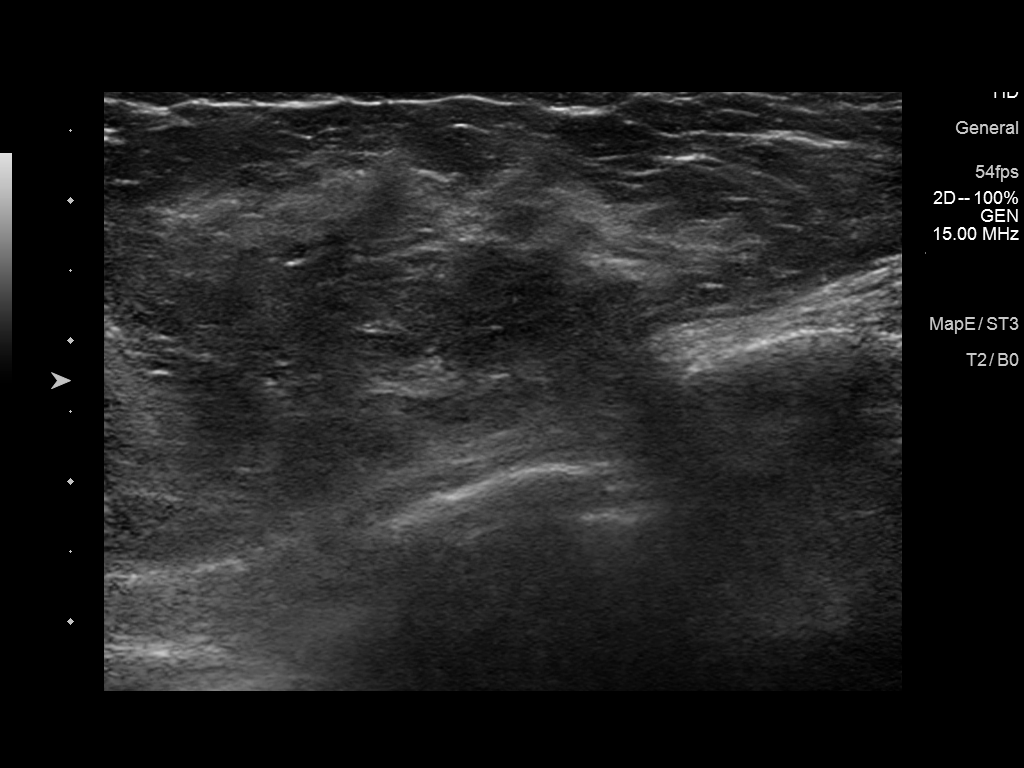

[2 of 2 positions shown; findings below may reference images not displayed]

ACR Breast Density Category c: The breast tissue is heterogeneously
dense, which may obscure small masses.
FINDINGS: Full field CC and MLO views and a spot tangential view of the
palpable concern were obtained. The palpable concern was marked with
a metallic BB.

No mammographic abnormality in the area of palpable concern in the
lower breast. Normal fibroglandular tissue extending very close to
the skin surface is present in this location.

Partially obscured isodense masses in the upper breast, unchanged
from prior mammograms. No new or suspicious findings.

Targeted ultrasound is performed in the area of concern,
demonstrating normal fibroglandular tissue at the 6:30 o'clock
position 8 cm from the nipple. No cyst, solid mass or abnormal
acoustic shadowing is identified.

On correlative physical examination, the site of palpable thickening
and focal pain and tenderness corresponds to the costal cartilage of
an anterior RIGHT rib. There is no palpable abnormality in the lower
breast.
IMPRESSION: No mammographic or sonographic evidence of malignancy involving the
RIGHT breast.

RECOMMENDATION:
Annual BILATERAL screening mammography which is due in September 2022.

I have discussed the findings and recommendations with the patient.
If applicable, a reminder letter will be sent to the patient
regarding the next appointment.

BI-RADS CATEGORY  2: Benign.

## 2022-09-29 ENCOUNTER — Emergency Department
Admission: EM | Admit: 2022-09-29 | Discharge: 2022-09-30 | Disposition: A | Discharge status: Home / Self Care | Payer: 59 | Attending: Emergency Medicine | Admitting: Emergency Medicine

## 2022-09-29 DIAGNOSIS — R1013 Epigastric pain: Secondary | ICD-10-CM | POA: Diagnosis not present

## 2022-09-29 DIAGNOSIS — R1012 Left upper quadrant pain: Secondary | ICD-10-CM | POA: Insufficient documentation

## 2022-09-29 DIAGNOSIS — Z7951 Long term (current) use of inhaled steroids: Secondary | ICD-10-CM | POA: Insufficient documentation

## 2022-09-29 DIAGNOSIS — Z79899 Other long term (current) drug therapy: Secondary | ICD-10-CM | POA: Diagnosis not present

## 2022-09-29 DIAGNOSIS — N644 Mastodynia: Secondary | ICD-10-CM | POA: Diagnosis not present

## 2022-09-29 DIAGNOSIS — J45909 Unspecified asthma, uncomplicated: Secondary | ICD-10-CM | POA: Insufficient documentation

## 2022-09-29 DIAGNOSIS — R059 Cough, unspecified: Secondary | ICD-10-CM | POA: Insufficient documentation

## 2022-09-29 DIAGNOSIS — E039 Hypothyroidism, unspecified: Secondary | ICD-10-CM | POA: Diagnosis not present

## 2022-09-29 DIAGNOSIS — R109 Unspecified abdominal pain: Secondary | ICD-10-CM

## 2022-09-29 LAB — URINALYSIS, ROUTINE W REFLEX MICROSCOPIC
Bilirubin Urine: NEGATIVE
Glucose, UA: NEGATIVE mg/dL
Ketones, ur: NEGATIVE mg/dL
Leukocytes,Ua: NEGATIVE
Nitrite: NEGATIVE
Protein, ur: NEGATIVE mg/dL
Specific Gravity, Urine: 1.006 (ref 1.005–1.030)
WBC, UA: NONE SEEN WBC/hpf (ref 0–5)
pH: 5 (ref 5.0–8.0)

## 2022-09-29 LAB — CBC WITH DIFFERENTIAL/PLATELET
Abs Immature Granulocytes: 0.07 10*3/uL (ref 0.00–0.07)
Basophils Absolute: 0.1 10*3/uL (ref 0.0–0.1)
Basophils Relative: 1 %
Eosinophils Absolute: 0.3 10*3/uL (ref 0.0–0.5)
Eosinophils Relative: 2 %
HCT: 40.7 % (ref 36.0–46.0)
Hemoglobin: 14.1 g/dL (ref 12.0–15.0)
Immature Granulocytes: 1 %
Lymphocytes Relative: 31 %
Lymphs Abs: 3.9 10*3/uL (ref 0.7–4.0)
MCH: 30.6 pg (ref 26.0–34.0)
MCHC: 34.6 g/dL (ref 30.0–36.0)
MCV: 88.3 fL (ref 80.0–100.0)
Monocytes Absolute: 0.6 10*3/uL (ref 0.1–1.0)
Monocytes Relative: 5 %
Neutro Abs: 7.7 10*3/uL (ref 1.7–7.7)
Neutrophils Relative %: 60 %
Platelets: 444 10*3/uL — ABNORMAL HIGH (ref 150–400)
RBC: 4.61 MIL/uL (ref 3.87–5.11)
RDW: 13.5 % (ref 11.5–15.5)
WBC: 12.7 10*3/uL — ABNORMAL HIGH (ref 4.0–10.5)
nRBC: 0 % (ref 0.0–0.2)

## 2022-09-29 LAB — COMPREHENSIVE METABOLIC PANEL
ALT: 34 U/L (ref 0–44)
AST: 26 U/L (ref 15–41)
Albumin: 4.4 g/dL (ref 3.5–5.0)
Alkaline Phosphatase: 72 U/L (ref 38–126)
Anion gap: 8 (ref 5–15)
BUN: 15 mg/dL (ref 6–20)
CO2: 25 mmol/L (ref 22–32)
Calcium: 8.9 mg/dL (ref 8.9–10.3)
Chloride: 104 mmol/L (ref 98–111)
Creatinine, Ser: 0.73 mg/dL (ref 0.44–1.00)
GFR, Estimated: >60 mL/min (ref >60)
Glucose, Bld: 98 mg/dL (ref 70–99)
Potassium: 3.7 mmol/L (ref 3.5–5.1)
Sodium: 137 mmol/L (ref 135–145)
Total Bilirubin: 0.5 mg/dL (ref 0.3–1.2)
Total Protein: 7.6 g/dL (ref 6.5–8.1)

## 2022-09-29 LAB — LIPASE, BLOOD: Lipase: 33 U/L (ref 11–51)

## 2022-09-29 LAB — TROPONIN I (HIGH SENSITIVITY): Troponin I (High Sensitivity): 4 ng/L (ref <18)

## 2022-09-29 MED ORDER — KETOROLAC TROMETHAMINE 30 MG/ML IJ SOLN
15.0000 mg | Freq: Once | INTRAMUSCULAR | Status: AC
  Administered 2022-09-29: 15 mg via INTRAVENOUS
  Filled 2022-09-29: qty 1

## 2022-09-29 NOTE — ED Notes (Signed)
Pt stuck twice for blood work with no success. Lab called to draw labs

## 2022-09-29 NOTE — ED Provider Notes (Signed)
[facility] Provider Note    Event Date/Time   First MD Initiated Contact with Patient 09/29/22 2302     (approximate)   History   Abdominal Pain   HPI  Kimberly Mcbride is a 41 y.o. female who presents to the ED from home with a chief complaint of abdominal pain.  Patient reports sharp and burning pain beneath left breast rating around into her back since last night.  Endorses bloating x1 week.  Exacerbated by certain movements.  Denies recent injury/fall/trauma.  Currently taking amoxicillin for sinus infection.  Endorses dry cough.  Denies fever/chills, shortness of breath, nausea, vomiting, dysuria or diarrhea.     Past Medical History   Past Medical History:  Diagnosis Date   Anxiety    Asthma    Depression    Dysrhythmia    DUE TO THYROID   Headache    MIGRAINS/ H/O   Hypothyroidism      Active Problem List   Patient Active Problem List   Diagnosis Date Noted   Post-operative state 05/02/2017     Past Surgical History   Past Surgical History:  Procedure Laterality Date   ABDOMINAL HYSTERECTOMY     DIAGNOSTIC LAPAROSCOPY     LAPAROSCOPIC VAGINAL HYSTERECTOMY WITH SALPINGECTOMY Bilateral 05/02/2017   Procedure: LAPAROSCOPIC ASSISTED VAGINAL HYSTERECTOMY WITH SALPINGECTOMY;  Surgeon: Boykin Nearing, MD;  Location: [facility] ORS;  Service: Gynecology;  Laterality: Bilateral;   TONSILLECTOMY       Home Medications   Prior to Admission medications   Medication Sig Start Date End Date Taking? Authorizing Provider  lidocaine (LIDODERM) 5 % Place 1 patch onto the skin daily. Remove & Discard patch within 12 hours or as directed by MD 09/30/22  Yes Paulette Blanch, MD  naproxen (NAPROSYN) 500 MG tablet Take 1 tablet (500 mg total) by mouth 2 (two) times daily with a meal. 09/30/22  Yes Paulette Blanch, MD  valACYclovir (VALTREX) 1000 MG tablet Take 1 tablet (1,000 mg total) by mouth 3 (three) times daily. 09/30/22  Yes Paulette Blanch, MD   hydrochlorothiazide (HYDRODIURIL) 25 MG tablet Take 12.5-25 mg by mouth 2 (two) times daily. 12.5 mg in the evening & 25 mg in the morning. 12/27/16   [provider]  ibuprofen (ADVIL,MOTRIN) 800 MG tablet Take 1 tablet (800 mg total) by mouth every 8 (eight) hours as needed. 05/03/17   Schermerhorn, Gwen Her, MD  omeprazole (PRILOSEC) 20 MG capsule Take 20 mg by mouth daily. 07/18/20   [provider]  ondansetron (ZOFRAN) 4 MG tablet Take 1 tablet (4 mg total) by mouth every 6 (six) hours as needed for nausea. 05/03/17   Schermerhorn, Gwen Her, MD  oxyCODONE-acetaminophen (PERCOCET/ROXICET) 5-325 MG tablet Take 1-2 tablets by mouth every 4 (four) hours as needed for moderate pain (moderate to severe pain (when tolerating fluids)). 05/03/17   Schermerhorn, Gwen Her, MD  Soft Lens Products (REWETTING DROPS) SOLN Place 1-2 drops into both eyes daily as needed (for dry eyesl).    [provider]  SYNTHROID 112 MCG tablet Take 112 mcg by mouth daily before breakfast.  12/26/16   [provider]     Allergies  Codeine   Family History  No family history on file.   Physical Exam  Triage Vital Signs: ED Triage Vitals  Enc Vitals Group     BP 09/29/22 2152 126/86     Pulse Rate 09/29/22 2152 77     Resp 09/29/22 2152  18     Temp 09/29/22 2152 98.6 F (37 C)     Temp Source 09/29/22 2152 Oral     SpO2 09/29/22 2152 97 %     Weight 09/29/22 2150 209 lb 14.1 oz (95.2 kg)     Height 09/29/22 2150 '5\' 4"'$  (1.626 m)     Head Circumference --      Peak Flow --      Pain Score 09/29/22 2150 7     Pain Loc --      Pain Edu? --      Excl. in [place]? --     Updated Vital Signs: BP 130/80   Pulse 74   Temp 98.6 F (37 C) (Oral)   Resp 20   Ht '5\' 4"'$  (1.626 m)   Wt 95.2 kg   LMP 03/28/2017 (Approximate)   SpO2 100%   BMI 36.03 kg/m    General: Awake, mild distress.  CV:  RRR.  Good peripheral perfusion.  Resp:  Normal effort.  CTA B.  Left lateral ribs  tender to palpation. Abd:  Mild tenderness to epigastrium and left upper quadrant without rebound or guarding.  No distention.  Other:  No truncal vesicles.  Bilateral calves are supple, nontender and nonswollen.   ED Results / Procedures / Treatments  Labs (all labs ordered are listed, but only abnormal results are displayed) Labs Reviewed  CBC WITH DIFFERENTIAL/PLATELET - Abnormal; Notable for the following components:      Result Value   WBC 12.7 (*)    Platelets 444 (*)    All other components within normal limits  URINALYSIS, ROUTINE W REFLEX MICROSCOPIC - Abnormal; Notable for the following components:   Color, Urine STRAW (*)    APPearance HAZY (*)    Hgb urine dipstick SMALL (*)    Bacteria, UA RARE (*)    All other components within normal limits  COMPREHENSIVE METABOLIC PANEL  LIPASE, BLOOD  TROPONIN I (HIGH SENSITIVITY)     EKG  ED ECG REPORT I, Gabrella Stroh J, the attending physician, personally viewed and interpreted this ECG.   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  Abdominal pain. EXAM: CT ANGIOGRAPHY CHEST CT ABDOMEN AND PELVIS WITH CONTRAST TECHNIQUE: Multidetector CT imaging of the chest was performed using the standard protocol during bolus administration of intravenous contrast. Multiplanar CT image reconstructions and MIPs were obtained to evaluate the vascular anatomy. Multidetector CT imaging of the abdomen and pelvis was performed using the standard protocol during bolus administration of  intravenous contrast. RADIATION DOSE REDUCTION: This exam was performed according to the departmental dose-optimization program which includes automated exposure control, adjustment of the mA and/or kV according to patient size and/or use of iterative reconstruction technique. CONTRAST:  14m OMNIPAQUE IOHEXOL 350 MG/ML SOLN COMPARISON:  CT abdomen pelvis dated 01/07/2017. FINDINGS: CTA CHEST FINDINGS Cardiovascular: There is no cardiomegaly or pericardial effusion. The thoracic aorta is unremarkable. No pulmonary artery embolus identified. Mediastinum/Nodes: No hilar or mediastinal adenopathy. The esophagus is grossly unremarkable. No mediastinal fluid collection. Lungs/Pleura: Right  lung base nodules measure up to 5 mm (85/3) similar to CT of 2018. No follow-up. The lungs are clear. There is no pleural effusion or pneumothorax. The central airways are patent. Musculoskeletal: No chest wall abnormality. No acute or significant osseous findings. Review of the MIP images confirms the above findings. CT ABDOMEN and PELVIS FINDINGS No intra-abdominal free air or free fluid. Hepatobiliary: No focal liver abnormality is seen. No gallstones, gallbladder wall thickening, or biliary dilatation. Pancreas: Unremarkable. No pancreatic ductal dilatation or surrounding inflammatory changes. Spleen: Normal in size without focal abnormality. Adrenals/Urinary Tract: The adrenal glands unremarkable. Small left renal interpolar cyst. This can be better evaluated with ultrasound on a nonemergent/outpatient basis if clinically indicated. There is no hydronephrosis on either side. The visualized ureters and urinary bladder probable. Stomach/Bowel: There is no bowel obstruction or active inflammation. The appendix is normal. Vascular/Lymphatic: The abdominal aorta and IVC are unremarkable. No portal venous gas. There is no adenopathy. Reproductive: Hysterectomy.  No adnexal masses. Other: Small fat containing umbilical hernia.  Musculoskeletal: No acute or significant osseous findings. Review of the MIP images confirms the above findings. IMPRESSION: No acute intrathoracic, abdominal, or pelvic pathology. No pulmonary artery embolus identified. Electronically Signed   By: Anner Crete M.D.   On: 09/30/2022 00:46   CT Abdomen Pelvis W Contrast  Result Date: 09/30/2022 CLINICAL DATA:  Concern for pulmonary embolism.  Abdominal pain. EXAM: CT ANGIOGRAPHY CHEST CT ABDOMEN AND PELVIS WITH CONTRAST TECHNIQUE: Multidetector CT imaging of the chest was performed using the standard protocol during bolus administration of intravenous contrast. Multiplanar CT image reconstructions and MIPs were obtained to evaluate the vascular anatomy. Multidetector CT imaging of the abdomen and pelvis was performed using the standard protocol during bolus administration of intravenous contrast. RADIATION DOSE REDUCTION: This exam was performed according to the departmental dose-optimization program which includes automated exposure control, adjustment of the mA and/or kV according to patient size and/or use of iterative reconstruction technique. CONTRAST:  147m OMNIPAQUE IOHEXOL 350 MG/ML SOLN COMPARISON:  CT abdomen pelvis dated 01/07/2017. FINDINGS: CTA CHEST FINDINGS Cardiovascular: There is no cardiomegaly or pericardial effusion. The thoracic aorta is unremarkable. No pulmonary artery embolus identified. Mediastinum/Nodes: No hilar or mediastinal adenopathy. The esophagus is grossly unremarkable. No mediastinal fluid collection. Lungs/Pleura: Right lung base nodules measure up to 5 mm (85/3) similar to CT of 2018. No follow-up. The lungs are clear. There is no pleural effusion or pneumothorax. The central airways are patent. Musculoskeletal: No chest wall abnormality. No acute or significant osseous findings. Review of the MIP images confirms the above findings. CT ABDOMEN and PELVIS FINDINGS No intra-abdominal free air or free fluid. Hepatobiliary:  No focal liver abnormality is seen. No gallstones, gallbladder wall thickening, or biliary dilatation. Pancreas: Unremarkable. No pancreatic ductal dilatation or surrounding inflammatory changes. Spleen: Normal in size without focal abnormality. Adrenals/Urinary Tract: The adrenal glands unremarkable. Small left renal interpolar cyst. This can be better evaluated with ultrasound on a nonemergent/outpatient basis if clinically indicated. There is no hydronephrosis on either side. The visualized ureters and urinary bladder probable. Stomach/Bowel: There is no bowel obstruction or active inflammation. The appendix is normal. Vascular/Lymphatic: The abdominal aorta and IVC are unremarkable. No portal venous gas. There is no adenopathy. Reproductive: Hysterectomy.  No adnexal masses. Other: Small fat containing umbilical hernia. Musculoskeletal: No acute or significant osseous findings. Review of the MIP images confirms the above findings. IMPRESSION: No acute intrathoracic, abdominal, or pelvic pathology. No pulmonary artery embolus identified. Electronically Signed  By: Anner Crete M.D.   On: 09/30/2022 00:46     PROCEDURES:  Critical Care performed: No  Procedures   MEDICATIONS ORDERED IN ED: Medications  ketorolac (TORADOL) 30 MG/ML injection 15 mg (15 mg Intravenous Given 09/29/22 2348)  iohexol (OMNIPAQUE) 350 MG/ML injection 100 mL (100 mLs Intravenous Contrast Given 09/30/22 0016)     IMPRESSION / MDM / ASSESSMENT AND PLAN / ED COURSE  I reviewed the triage vital signs and the nursing notes.                             41 year old female presenting with left chest/upper quadrant pain. Differential diagnosis includes, but is not limited to, ACS, aortic dissection, pulmonary embolism, cardiac tamponade, pneumothorax, pneumonia, pericarditis, myocarditis, GI-related causes including esophagitis/gastritis, and musculoskeletal chest wall pain.   Differential diagnosis includes, but is not  limited to, biliary disease (biliary colic, acute cholecystitis, cholangitis, choledocholithiasis, etc), intrathoracic causes for epigastric abdominal pain including ACS, gastritis, duodenitis, pancreatitis, small bowel or large bowel obstruction, abdominal aortic aneurysm, hernia, and ulcer(s).  I have personally reviewed patient's records and note a PCP office visit on 09/18/2022 for sinusitis.  Patient's presentation is most consistent with acute presentation with potential threat to life or bodily function.  Basic lab work is pending; WBC 12.57, UA unremarkable.  Will obtain CTA chest to evaluate for PE as well as CT abdomen/pelvis to evaluate etiology of abdominal pain.  Will reassess.  Clinical Course as of 09/30/22 0659  Thu Sep 30, 2022  0122 Updated patient and spouse on all test results, WBC 12.7, normal electrolytes, negative UA.  CTA chest, abdomen and pelvis unremarkable for acute process.  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  Patient has had shingles before.  Will prescribe Valtrex for her to take in the event vesicles are visible in the next 24 to 48 hours.  Will apply lidocaine patch and continue NSAIDs.  Strict return precautions given.  Both verbalized understanding agree with plan of care. [JS]    Clinical Course User Index [JS] Paulette Blanch, MD     FINAL CLINICAL IMPRESSION(S) / ED DIAGNOSES   Final diagnoses:  Left upper quadrant abdominal pain  Left flank pain     Rx / DC Orders   ED Discharge Orders          Ordered    naproxen (NAPROSYN) 500 MG tablet  2 times daily with meals        09/30/22 0129    lidocaine (LIDODERM) 5 %  Every 24 hours        09/30/22 0129    valACYclovir (VALTREX) 1000 MG tablet  3 times daily        09/30/22 0132             Note:  This document was prepared using Dragon voice recognition software and may include unintentional dictation errors.   Paulette Blanch,  MD 09/30/22 [phone]

## 2022-09-29 NOTE — ED Triage Notes (Signed)
Patient ambulatory to triage with steady gait, without difficulty or distress noted; pt reports pain beneath left breast radiating around into back since last night accomp by bloating; st tenderness to touch; denies hx of same; currently taking amoxi for sinus infection

## 2022-09-30 ENCOUNTER — Emergency Department: Payer: 59

## 2022-09-30 MED ORDER — LIDOCAINE 5 % EX PTCH: 1.0000 | MEDICATED_PATCH | CUTANEOUS | 0 refills | Status: AC

## 2022-09-30 MED ORDER — LIDOCAINE 5 % EX PTCH
1.0000 | MEDICATED_PATCH | Freq: Once | CUTANEOUS | Status: DC
  Administered 2022-09-30: 1 via TRANSDERMAL
  Filled 2022-09-30: qty 1

## 2022-09-30 MED ORDER — NAPROXEN 500 MG PO TABS: 500.0000 mg | ORAL_TABLET | Freq: Two times a day (BID) | ORAL | 0 refills | Status: AC

## 2022-09-30 MED ORDER — IOHEXOL 350 MG/ML SOLN
100.0000 mL | Freq: Once | INTRAVENOUS | Status: AC | PRN
  Administered 2022-09-30: 100 mL via INTRAVENOUS

## 2022-09-30 MED ORDER — VALACYCLOVIR HCL 1 G PO TABS: 1000.0000 mg | ORAL_TABLET | Freq: Three times a day (TID) | ORAL | 0 refills | Status: AC

## 2022-09-30 NOTE — Discharge Instructions (Addendum)
1.  Take Naprosyn twice daily as needed for pain. 2.  Apply Lidocaine patch as instructed as needed for pain. 3.  You may start Valtrex prescription if you see a rash similar to chickenpox develop in 2 to 3 days. 4.  Return to the ER for worsening symptoms, persistent vomiting, difficulty breathing or other concerns.

## 2022-10-11 ENCOUNTER — Ambulatory Visit (INDEPENDENT_AMBULATORY_CARE_PROVIDER_SITE_OTHER): Payer: 59

## 2022-10-11 ENCOUNTER — Ambulatory Visit
Admission: EM | Admit: 2022-10-11 | Discharge: 2022-10-11 | Disposition: A | Discharge status: Home / Self Care | Payer: 59

## 2022-10-11 DIAGNOSIS — R0781 Pleurodynia: Secondary | ICD-10-CM

## 2022-10-11 DIAGNOSIS — M7918 Myalgia, other site: Secondary | ICD-10-CM

## 2022-10-11 MED ORDER — CYCLOBENZAPRINE HCL 10 MG PO TABS: 10.0000 mg | ORAL_TABLET | Freq: Two times a day (BID) | ORAL | 0 refills | Status: AC | PRN

## 2022-10-11 MED ORDER — KETOROLAC TROMETHAMINE 30 MG/ML IJ SOLN
30.0000 mg | Freq: Once | INTRAMUSCULAR | Status: AC
  Administered 2022-10-11: 30 mg via INTRAMUSCULAR

## 2022-10-11 MED ORDER — KETOROLAC TROMETHAMINE 10 MG PO TABS: 10.0000 mg | ORAL_TABLET | Freq: Four times a day (QID) | ORAL | 0 refills | Status: AC | PRN

## 2022-10-11 NOTE — Discharge Instructions (Addendum)
I have ordered ketorolac which is an oral version of the Toradol injected into your muscle today.  I recommend trying this in place of naproxen.  Follow up with your primary care provider if your symptoms are worsening or not improving.

## 2022-10-11 NOTE — ED Triage Notes (Signed)
Pt reports she is having left side rib pain. Was seen 2 weeks ago said she could have shnigle. Did a complete work up but found nothing wrong. Her primary gave her gabepentin said it could be shingles but no rash formed. Took the predinsone still no relief. Can't sleep normal, trouble walking. It feels like a shooting pain. Havent had an x ray. But has had a ct scan that showed nothing

## 2022-10-11 NOTE — ED Provider Notes (Signed)
[facility]    CSN: [id number] Arrival date & time: 10/11/22  1416      History   Chief Complaint Chief Complaint  Patient presents with   Abdominal Pain    Left upper abdomen/side/back.. see previous er and dr notes from past ten days - Entered by patient    HPI Kimberly Mcbride is a 41 y.o. female.    Abdominal Pain   Presents to urgent care with of left sided rib pain.  Patient was seen in ED 2 weeks ago with complaint of abdominal pain that she described as sharp and burning pain below her left breast and radiating to her back.  She had unremarkable abdominal pelvic CT as well as CTA chest looking to rule out PE.  She was discharged with a course of Valtrex presuming shingles.  Seen by her PCP a week later with same symptoms.  Provider discharged her with gabapentin with same presumption of shingles.  Presents today with continued acute left flank pain at the lowest left rib.  She states she will use gabapentin once which helped her sleep but she awoke with pain in the middle of the night.  Denies any improvement with Valtrex.  She states the pain feels like it is "inside", not on the surface of her skin.  She endorses previous episode of shingles and recalls the burning tingling pain and states this is very different.  Past Medical History:  Diagnosis Date   Anxiety    Asthma    Depression    Dysrhythmia    DUE TO THYROID   Headache    MIGRAINS/ H/O   Hypothyroidism     Patient Active Problem List   Diagnosis Date Noted   Post-operative state 05/02/2017    Past Surgical History:  Procedure Laterality Date   ABDOMINAL HYSTERECTOMY     DIAGNOSTIC LAPAROSCOPY     LAPAROSCOPIC VAGINAL HYSTERECTOMY WITH SALPINGECTOMY Bilateral 05/02/2017   Procedure: LAPAROSCOPIC ASSISTED VAGINAL HYSTERECTOMY WITH SALPINGECTOMY;  Surgeon: Boykin Nearing, MD;  Location: [facility] ORS;  Service: Gynecology;  Laterality: Bilateral;   TONSILLECTOMY      OB History    No obstetric history on file.      Home Medications    Prior to Admission medications   Medication Sig Start Date End Date Taking? Authorizing Provider  DULoxetine (CYMBALTA) 30 MG capsule Take by mouth. 07/14/22 07/14/23 Yes [provider]  levothyroxine (SYNTHROID) 88 MCG tablet TAKE 1 TABLET BY MOUTH ON AN EMPTY STOMACH WITH A GLASS OF WATER AT LEAST 30-60 MINUTES BEFORE BREAKFAST. 07/14/22  Yes [provider]  predniSONE (DELTASONE) 10 MG tablet 3,3,2,2,1,1 10/06/22  Yes [provider]  hydrochlorothiazide (HYDRODIURIL) 25 MG tablet Take 12.5-25 mg by mouth 2 (two) times daily. 12.5 mg in the evening & 25 mg in the morning. 12/27/16   [provider]  ibuprofen (ADVIL,MOTRIN) 800 MG tablet Take 1 tablet (800 mg total) by mouth every 8 (eight) hours as needed. 05/03/17   Schermerhorn, Gwen Her, MD  lidocaine (LIDODERM) 5 % Place 1 patch onto the skin daily. Remove & Discard patch within 12 hours or as directed by MD 09/30/22   Paulette Blanch, MD  naproxen (NAPROSYN) 500 MG tablet Take 1 tablet (500 mg total) by mouth 2 (two) times daily with a meal. 09/30/22   Paulette Blanch, MD  omeprazole (PRILOSEC) 20 MG capsule Take 20 mg by mouth daily. 07/18/20   [provider]  ondansetron (ZOFRAN) 4 MG  tablet Take 1 tablet (4 mg total) by mouth every 6 (six) hours as needed for nausea. 05/03/17   Schermerhorn, Gwen Her, MD  oxyCODONE-acetaminophen (PERCOCET/ROXICET) 5-325 MG tablet Take 1-2 tablets by mouth every 4 (four) hours as needed for moderate pain (moderate to severe pain (when tolerating fluids)). 05/03/17   Schermerhorn, Gwen Her, MD  Soft Lens Products (REWETTING DROPS) SOLN Place 1-2 drops into both eyes daily as needed (for dry eyesl).    [provider]  SYNTHROID 112 MCG tablet Take 112 mcg by mouth daily before breakfast.  12/26/16   [provider]  valACYclovir (VALTREX) 1000 MG tablet Take 1 tablet (1,000 mg total) by mouth 3  (three) times daily. 09/30/22   Paulette Blanch, MD    Family History History reviewed. No pertinent family history.  Social History Social History   Tobacco Use   Smoking status: Never   Smokeless tobacco: Never  Substance Use Topics   Alcohol use: No   Drug use: No     Allergies   Codeine   Review of Systems Review of Systems  Gastrointestinal:  Positive for abdominal pain.     Physical Exam Triage Vital Signs ED Triage Vitals  Enc Vitals Group     BP 10/11/22 1514 128/71     Pulse Rate 10/11/22 1514 81     Resp 10/11/22 1514 20     Temp 10/11/22 1514 98.1 F (36.7 C)     Temp Source 10/11/22 1514 Oral     SpO2 10/11/22 1514 97 %     Weight --      Height --      Head Circumference --      Peak Flow --      Pain Score 10/11/22 1519 7     Pain Loc --      Pain Edu? --      Excl. in [place]? --    No data found.  Updated Vital Signs BP 128/71 (BP Location: Left Arm)   Pulse 81   Temp 98.1 F (36.7 C) (Oral)   Resp 20   LMP 03/28/2017 (Approximate)   SpO2 97%   Visual Acuity Right Eye Distance:   Left Eye Distance:   Bilateral Distance:    Right Eye Near:   Left Eye Near:    Bilateral Near:     Physical Exam Vitals reviewed.  Constitutional:      Appearance: She is well-developed.  Chest:    Abdominal:     General: Bowel sounds are normal.     Tenderness: There is abdominal tenderness in the left upper quadrant.  Skin:    General: Skin is warm and dry.     Findings: No rash.  Neurological:     General: No focal deficit present.     Mental Status: She is alert and oriented to person, place, and time.  Psychiatric:        Mood and Affect: Mood normal.        Behavior: Behavior normal.      UC Treatments / Results  Labs (all labs ordered are listed, but only abnormal results are displayed) Labs Reviewed - No data to display  EKG   Radiology No results found.  Procedures Procedures (including critical care time)  Medications  Ordered in UC Medications - No data to display  Initial Impression / Assessment and Plan / UC Course  I have reviewed the triage vital signs and the nursing notes.  Pertinent labs &  imaging results that were available during my care of the patient were reviewed by me and considered in my medical decision making (see chart for details).   Discussed her clinical course thus far including the test she has already had completed.  Her symptoms seem to be focused on her left lower rib cage.  Reviewed her previous imaging and agreed to order x-ray of her left rib cage which showed no fracture or bony lesions as expected.  Suspect the cause of her symptoms are related to her intercostal muscles though no clear etiology.  Will provide muscle relaxant and refer her back to her primary care for additional evaluation and referral  Final Clinical Impressions(s) / UC Diagnoses   Final diagnoses:  None   Discharge Instructions   None    ED Prescriptions   None    PDMP not reviewed this encounter.   Rose Phi, [place] 10/11/22 1616

## 2022-12-05 ENCOUNTER — Ambulatory Visit
Admission: EM | Admit: 2022-12-05 | Discharge: 2022-12-05 | Disposition: A | Discharge status: Home / Self Care | Payer: 59 | Attending: Urgent Care | Admitting: Urgent Care

## 2022-12-05 DIAGNOSIS — H1032 Unspecified acute conjunctivitis, left eye: Secondary | ICD-10-CM

## 2022-12-05 MED ORDER — POLYMYXIN B-TRIMETHOPRIM 10000-0.1 UNIT/ML-% OP SOLN: 1.0000 [drp] | Freq: Four times a day (QID) | OPHTHALMIC | 0 refills | Status: AC

## 2022-12-05 NOTE — ED Provider Notes (Signed)
Kimberly Mcbride    CSN: [id number] Arrival date & time: 12/05/22  1442      History   Chief Complaint Chief Complaint  Patient presents with   Eye Problem    HPI Kimberly Mcbride is a 42 y.o. female.    Eye Problem   Presents to urgent care with left eye drainage, redness, swelling, pain starting yesterday.  Past Medical History:  Diagnosis Date   Anxiety    Asthma    Depression    Dysrhythmia    DUE TO THYROID   Headache    MIGRAINS/ H/O   Hypothyroidism     Patient Active Problem List   Diagnosis Date Noted   Post-operative state 05/02/2017    Past Surgical History:  Procedure Laterality Date   ABDOMINAL HYSTERECTOMY     DIAGNOSTIC LAPAROSCOPY     LAPAROSCOPIC VAGINAL HYSTERECTOMY WITH SALPINGECTOMY Bilateral 05/02/2017   Procedure: LAPAROSCOPIC ASSISTED VAGINAL HYSTERECTOMY WITH SALPINGECTOMY;  Surgeon: Boykin Nearing, MD;  Location: [facility] ORS;  Service: Gynecology;  Laterality: Bilateral;   TONSILLECTOMY      OB History   No obstetric history on file.      Home Medications    Prior to Admission medications   Medication Sig Start Date End Date Taking? Authorizing Provider  cyclobenzaprine (FLEXERIL) 10 MG tablet Take 1 tablet (10 mg total) by mouth 2 (two) times daily as needed for muscle spasms. 10/11/22   Jacobi Nile, Annie Main, FNP  DULoxetine (CYMBALTA) 30 MG capsule Take by mouth. 07/14/22 07/14/23  [provider]  hydrochlorothiazide (HYDRODIURIL) 25 MG tablet Take 12.5-25 mg by mouth 2 (two) times daily. 12.5 mg in the evening & 25 mg in the morning. 12/27/16   [provider]  ketorolac (TORADOL) 10 MG tablet Take 1 tablet (10 mg total) by mouth every 6 (six) hours as needed. 10/11/22   Darrelle Wiberg, Annie Main, FNP  levothyroxine (SYNTHROID) 88 MCG tablet TAKE 1 TABLET BY MOUTH ON AN EMPTY STOMACH WITH A GLASS OF WATER AT LEAST 30-60 MINUTES BEFORE BREAKFAST. 07/14/22   [provider]  lidocaine (LIDODERM) 5 % Place  1 patch onto the skin daily. Remove & Discard patch within 12 hours or as directed by MD 09/30/22   Paulette Blanch, MD  naproxen (NAPROSYN) 500 MG tablet Take 1 tablet (500 mg total) by mouth 2 (two) times daily with a meal. 09/30/22   Paulette Blanch, MD  omeprazole (PRILOSEC) 20 MG capsule Take 20 mg by mouth daily. 07/18/20   [provider]  ondansetron (ZOFRAN) 4 MG tablet Take 1 tablet (4 mg total) by mouth every 6 (six) hours as needed for nausea. 05/03/17   Schermerhorn, Gwen Her, MD  oxyCODONE-acetaminophen (PERCOCET/ROXICET) 5-325 MG tablet Take 1-2 tablets by mouth every 4 (four) hours as needed for moderate pain (moderate to severe pain (when tolerating fluids)). 05/03/17   Schermerhorn, Gwen Her, MD  predniSONE (DELTASONE) 10 MG tablet 3,3,2,2,1,1 10/06/22   [provider]  Soft Lens Products (REWETTING DROPS) SOLN Place 1-2 drops into both eyes daily as needed (for dry eyesl).    [provider]  SYNTHROID 112 MCG tablet Take 112 mcg by mouth daily before breakfast.  12/26/16   [provider]  valACYclovir (VALTREX) 1000 MG tablet Take 1 tablet (1,000 mg total) by mouth 3 (three) times daily. 09/30/22   Paulette Blanch, MD    Family History History reviewed. No pertinent family history.  Social History Social History   Tobacco Use  Smoking status: Never   Smokeless tobacco: Never  Substance Use Topics   Alcohol use: No   Drug use: No     Allergies   Codeine   Review of Systems Review of Systems   Physical Exam Triage Vital Signs ED Triage Vitals  Enc Vitals Group     BP 12/05/22 1524 127/80     Pulse Rate 12/05/22 1524 90     Resp 12/05/22 1524 18     Temp 12/05/22 1524 99 F (37.2 C)     Temp Source 12/05/22 1524 Oral     SpO2 12/05/22 1524 98 %     Weight --      Height --      Head Circumference --      Peak Flow --      Pain Score 12/05/22 1527 0     Pain Loc --      Pain Edu? --      Excl. in [place]? --    No data  found.  Updated Vital Signs BP 127/80 (BP Location: Left Arm)   Pulse 90   Temp 99 F (37.2 C) (Oral)   Resp 18   LMP 03/28/2017 (Approximate)   SpO2 98%   Visual Acuity Right Eye Distance:   Left Eye Distance:   Bilateral Distance:    Right Eye Near:   Left Eye Near:    Bilateral Near:     Physical Exam Vitals reviewed.  Constitutional:      Appearance: Normal appearance.  Eyes:     General:        Left eye: Discharge present. Skin:    General: Skin is warm and dry.  Neurological:     General: No focal deficit present.     Mental Status: She is alert and oriented to person, place, and time.  Psychiatric:        Mood and Affect: Mood normal.        Behavior: Behavior normal.      UC Treatments / Results  Labs (all labs ordered are listed, but only abnormal results are displayed) Labs Reviewed - No data to display  EKG   Radiology No results found.  Procedures Procedures (including critical care time)  Medications Ordered in UC Medications - No data to display  Initial Impression / Assessment and Plan / UC Course  I have reviewed the triage vital signs and the nursing notes.  Pertinent labs & imaging results that were available during my care of the patient were reviewed by me and considered in my medical decision making (see chart for details).   Purulent drainage from left eye.  Prescribing antibiotic ointment for acute bacterial conjunctivitis.   Final Clinical Impressions(s) / UC Diagnoses   Final diagnoses:  None   Discharge Instructions   None    ED Prescriptions   None    PDMP not reviewed this encounter.   Rose Phi, [place] 12/05/22 1538

## 2022-12-05 NOTE — Discharge Instructions (Addendum)
Follow up here or with your primary care provider if your symptoms are worsening or not improving with treatment.     

## 2022-12-05 NOTE — ED Triage Notes (Signed)
Pt. Presents to UC w/ c/o left eye drainage, redness and swelling that started yesterday. Pt. Also endorses head congestion.

## 2022-12-06 ENCOUNTER — Telehealth: Payer: 59 | Admitting: Physician Assistant

## 2022-12-06 DIAGNOSIS — B9689 Other specified bacterial agents as the cause of diseases classified elsewhere: Secondary | ICD-10-CM

## 2022-12-06 DIAGNOSIS — J019 Acute sinusitis, unspecified: Secondary | ICD-10-CM | POA: Diagnosis not present

## 2022-12-06 MED ORDER — AMOXICILLIN-POT CLAVULANATE 875-125 MG PO TABS: 1.0000 | ORAL_TABLET | Freq: Two times a day (BID) | ORAL | 0 refills | Status: DC

## 2022-12-06 NOTE — Progress Notes (Signed)

## 2023-01-13 ENCOUNTER — Other Ambulatory Visit: Payer: Self-pay | Admitting: Obstetrics and Gynecology

## 2023-01-13 DIAGNOSIS — Z1231 Encounter for screening mammogram for malignant neoplasm of breast: Secondary | ICD-10-CM

## 2023-02-03 ENCOUNTER — Ambulatory Visit
Admission: RE | Admit: 2023-02-03 | Discharge: 2023-02-03 | Disposition: A | Discharge status: Home / Self Care | Payer: 59 | Source: Ambulatory Visit | Attending: Obstetrics and Gynecology | Admitting: Obstetrics and Gynecology

## 2023-02-03 ENCOUNTER — Telehealth: Payer: 59 | Admitting: Physician Assistant

## 2023-02-03 DIAGNOSIS — Z1231 Encounter for screening mammogram for malignant neoplasm of breast: Secondary | ICD-10-CM | POA: Insufficient documentation

## 2023-02-03 DIAGNOSIS — J029 Acute pharyngitis, unspecified: Secondary | ICD-10-CM

## 2023-02-03 DIAGNOSIS — Z20818 Contact with and (suspected) exposure to other bacterial communicable diseases: Secondary | ICD-10-CM

## 2023-02-03 MED ORDER — AMOXICILLIN 500 MG PO TABS: 500.0000 mg | ORAL_TABLET | Freq: Two times a day (BID) | ORAL | 0 refills | Status: AC

## 2023-02-03 NOTE — Progress Notes (Signed)
I have spent 5 minutes in review of e-visit questionnaire, review and updating patient chart, medical decision making and response to patient.   Ashtan Girtman Cody Jackalynn Art, PA-C    

## 2023-02-03 NOTE — Progress Notes (Signed)
